# Patient Record
Sex: Male | Born: 1960 | Race: White | Hispanic: No | Marital: Married | Smoking: Never smoker
Health system: Southern US, Community
[De-identification: ages and names within clinical notes are randomized; demographics above are authoritative.]

## PROBLEM LIST (undated history)

## (undated) DIAGNOSIS — E538 Deficiency of other specified B group vitamins: Secondary | ICD-10-CM

## (undated) DIAGNOSIS — R040 Epistaxis: Secondary | ICD-10-CM

## (undated) DIAGNOSIS — I10 Essential (primary) hypertension: Secondary | ICD-10-CM

## (undated) DIAGNOSIS — M1990 Unspecified osteoarthritis, unspecified site: Secondary | ICD-10-CM

## (undated) DIAGNOSIS — K7581 Nonalcoholic steatohepatitis (NASH): Secondary | ICD-10-CM

## (undated) DIAGNOSIS — R58 Hemorrhage, not elsewhere classified: Secondary | ICD-10-CM

## (undated) DIAGNOSIS — E785 Hyperlipidemia, unspecified: Secondary | ICD-10-CM

## (undated) DIAGNOSIS — E559 Vitamin D deficiency, unspecified: Secondary | ICD-10-CM

## (undated) DIAGNOSIS — D61818 Other pancytopenia: Secondary | ICD-10-CM

## (undated) DIAGNOSIS — N189 Chronic kidney disease, unspecified: Secondary | ICD-10-CM

## (undated) DIAGNOSIS — D693 Immune thrombocytopenic purpura: Secondary | ICD-10-CM

## (undated) DIAGNOSIS — Z87442 Personal history of urinary calculi: Secondary | ICD-10-CM

## (undated) DIAGNOSIS — Z8619 Personal history of other infectious and parasitic diseases: Secondary | ICD-10-CM

## (undated) HISTORY — PX: SKIN GRAFT: SHX250

## (undated) HISTORY — PX: REPLACEMENT TOTAL KNEE: SUR1224

## (undated) HISTORY — PX: KNEE ARTHROSCOPY: SUR90

## (undated) HISTORY — PX: PYLOROPLASTY: SHX418

---

## 2004-05-11 HISTORY — PX: KNEE ARTHROSCOPY: SUR90

## 2004-11-30 ENCOUNTER — Emergency Department (HOSPITAL_COMMUNITY): Admission: EM | Admit: 2004-11-30 | Discharge: 2004-11-30 | Payer: Self-pay | Admitting: Emergency Medicine

## 2004-12-02 ENCOUNTER — Inpatient Hospital Stay (HOSPITAL_COMMUNITY): Admission: AD | Admit: 2004-12-02 | Discharge: 2004-12-06 | Payer: Self-pay | Admitting: Orthopedic Surgery

## 2009-06-11 HISTORY — PX: HERNIA REPAIR: SHX51

## 2009-06-21 ENCOUNTER — Observation Stay (HOSPITAL_COMMUNITY): Admission: EM | Admit: 2009-06-21 | Discharge: 2009-06-21 | Payer: Self-pay | Admitting: Emergency Medicine

## 2010-07-31 LAB — DIFFERENTIAL
Basophils Absolute: 0 K/uL (ref 0.0–0.1)
Basophils Relative: 1 % (ref 0–1)
Eosinophils Absolute: 0.2 K/uL (ref 0.0–0.7)
Eosinophils Relative: 2 % (ref 0–5)
Lymphocytes Relative: 29 % (ref 12–46)
Lymphs Abs: 2.2 K/uL (ref 0.7–4.0)
Monocytes Absolute: 0.4 K/uL (ref 0.1–1.0)
Monocytes Relative: 6 % (ref 3–12)
Neutro Abs: 4.8 K/uL (ref 1.7–7.7)
Neutrophils Relative %: 63 % (ref 43–77)

## 2010-07-31 LAB — CBC
HCT: 48.1 % (ref 39.0–52.0)
Hemoglobin: 16.2 g/dL (ref 13.0–17.0)
Platelets: 162 10*3/uL (ref 150–400)
RDW: 12.6 % (ref 11.5–15.5)

## 2010-07-31 LAB — COMPREHENSIVE METABOLIC PANEL
ALT: 59 U/L — ABNORMAL HIGH (ref 0–53)
BUN: 14 mg/dL (ref 6–23)
CO2: 26 mEq/L (ref 19–32)
Calcium: 9.7 mg/dL (ref 8.4–10.5)
Glucose, Bld: 119 mg/dL — ABNORMAL HIGH (ref 70–99)
Total Bilirubin: 1.2 mg/dL (ref 0.3–1.2)
Total Protein: 8.4 g/dL — ABNORMAL HIGH (ref 6.0–8.3)

## 2010-07-31 LAB — GLUCOSE, CAPILLARY: Glucose-Capillary: 119 mg/dL — ABNORMAL HIGH (ref 70–99)

## 2010-09-26 NOTE — Op Note (Signed)
[name], [name] NO.:  [id number]   MEDICAL RECORD NO.:  [id number]          PATIENT TYPE:  EMS   LOCATION:  MAJO                         FACILITY:  [facility]   PHYSICIAN:  Vanita Panda. Magnus Ivan, M.D.DATE OF BIRTH:  26-Jul-1960   DATE OF PROCEDURE:  11/30/2004  DATE OF DISCHARGE:                                 OPERATIVE REPORT   PREOPERATIVE DIAGNOSIS:  Right knee effusion with questionable infection,  status post arthroscopy five days ago.   POSTOPERATIVE DIAGNOSIS:  Right knee effusion with questionable infection,  status post arthroscopy five days ago.   OPERATION PERFORMED:  Right knee aspiration.   SURGEON:  Vanita Panda. Magnus Ivan, M.D.   ANESTHESIA:  1% local  lidocaine injection.   COMPLICATIONS:  None.   FINDINGS:  90 mL of serosanguineous and mainly bloody fluid from the right  knee.  Cultures, Gram stain and cell count pending.   COMPLICATIONS:  None.   INDICATIONS FOR PROCEDURE:  [name] is a 50 year old who underwent a  right knee arthroscopy by George Mustard, MD on this past Tuesday which was  five days ago.  The last couple of days he had increasing pain in his knee  and he reports at home he had a temperature of 102.  He finally came to the  emergency room today after continued pain in his knee.  The temperature was  100.  He has not been on any antibiotics.  A white blood cell count  peripherally was 13,000.  He had been on Vicodin and just Darvocet and had  continued pain and swelling of the knee.  On examination of the knee, he  certainly had painful attempts at range of motion.  There was a large  effusion of the knee.  It was warm.  There was no erythema, no drainage from  the arthroscopy sites.  Decision was made to aspirate the knee.   DESCRIPTION OF PROCEDURE:  After prepping the anterolateral corner of the  knee with Betadine and alcohol.  The skin was infiltrated with 1% plain  lidocaine.  I then inserted an 18 gauge  needle with a 60 mL syringe into the  knee through this anterolateral portal site and was able to aspirate 90 mL  of bloody fluid.  This was then sent to the lab for analysis of cell count,  Gram stain and culture.  Immediately following the procedure, I placed a  band-aid on  the leg and hemostasis was obtained easily.  The patient reported much  improvement in his level of comfort after the aspiration and there was much  less swelling on the knee.  The cultures are pending and the disposition  will depend on the Gram stain and the cell count.     _    CYB/MEDQ  D:  11/30/2004  T:  12/01/2004  Job:  [id number]

## 2010-09-26 NOTE — Discharge Summary (Signed)
NAMEJARID, SASSO NO.:  [id number]   MEDICAL RECORD NO.:  [id number]          PATIENT TYPE:  INP   LOCATION:  5022                         FACILITY:  [facility]   PHYSICIAN:  Nadara Mustard, MD     DATE OF BIRTH:  1960/08/05   DATE OF ADMISSION:  12/02/2004  DATE OF DISCHARGE:  12/06/2004                                 DISCHARGE SUMMARY   DIAGNOSIS:  Infected right knee, status post arthroscopy.   PROCEDURE:  Arthroscopic debridement.   DISCHARGE MEDICATIONS:  Clindamycin and Tylox.   DISCHARGE EQUIPMENT:  CPM machine for range of motion of the right knee.   Plan to follow up in the office in 1 week.   HISTORY OF PRESENT ILLNESS:  The patient is a 50 year old gentleman who has  developed an infection in his right knee status post arthroscopic  debridement for osteochondral defect and meniscal tear.  The patient has  undergone previous aspirations and presents at this time for arthroscopic  debridement.   HOSPITAL COURSE:  The patient's hospital course was essentially  unremarkable.  He was admitted on July 25 and underwent an irrigation and  debridement arthroscopically of the right knee.  Cultures were obtained x2  and a drain was placed deep in the wound.  Cultures were obtained.  The  patient was placed on IV Clindamycin.  Cultures were positive for coagulase-negative staph, which were sensitive to  the Clindamycin.  The patient was placed in a CPM due to his developing  arthrofibrosis and the patient was discharged to home with the CPM machine.  Cultures were finalized with coagulase-negative staph and were sensitive to  all antibiotics.  The patient has a prescription for Clindamycin and Tylox,  and he was discharged to home on December 06, 2004, in stable condition with  plan to follow up in the office on Monday.      Nadara Mustard, MD  Electronically Signed     MVD/MEDQ  D:  01/22/2005  T:  01/22/2005  Job:  [id number]

## 2010-09-26 NOTE — Op Note (Signed)
George Oneill, NAVARRETE NO.:  [id number]   MEDICAL RECORD NO.:  [id number]          PATIENT TYPE:  OIB   LOCATION:  3041                         FACILITY:  [facility]   PHYSICIAN:  Nadara Mustard, MD     DATE OF BIRTH:  04/04/1961   DATE OF PROCEDURE:  12/02/2004  DATE OF DISCHARGE:                                 OPERATIVE REPORT   PREOPERATIVE DIAGNOSIS:  Infected right knee, status post arthroscopy.   POSTOPERATIVE DIAGNOSIS:  Infected right knee, status post arthroscopy.   PROCEDURE:  Irrigation and debridement, arthroscopic debridement of the  right knee, cultures obtained x2.   SURGEON.:  Nadara Mustard, MD   ANESTHESIA:  General.   ESTIMATED BLOOD LOSS:  Minimal.   ANTIBIOTICS:  Clindamycin 600 mg IV.   DRAINS:  One medium Hemovac.   DISPOSITION:  To PACU in stable condition.  Plan for 23-hour observation.   INDICATION FOR PROCEDURE:  The patient is a 50 year old gentleman with  warmth and swelling of his right knee.  He is 1 week status post right knee  arthroscopy for debridement of osteochondral defect as well as meniscal  tears.  The patient initially presented to the emergency room on Sunday with  warmth and swelling of the knee.  This was aspirated by Dr. Magnus Ivan and  cultures were obtained.  The patient was started on p.o. clindamycin.  The  patient had followup in the office in 1 day.  The knee was re-aspirated;  again, synovial fluid findings were consistent with a hemarthrosis.  There  is no purulence.  There was no cellulitis.  The patient was then followed up  again today on Tuesday and the patient had recurrent swelling.  Due to his  persistent recurrent swelling, the patient now had a fever as high as 102.  There was no cellulitis, but due to his fever and recurrent swelling, the  patient was brought to the operating this time for a repeat arthroscopy for  irrigation, debridement and placement of a drain as well as placement on IV  antibiotics.  Risks and benefits were discussed including a persistent  infection, neurovascular injury, need for additional surgery.  The patient  states he understands and wishes  to proceed at this time.   DESCRIPTION OF PROCEDURE:  The patient was brought to OR room 5 and  underwent a general anesthetic.  After adequate level of anesthesia was  obtained, the patient was placed supine on the operating table and the right  lower extremity was prepped using DuraPrep and draped into a sterile field.  A superolateral portal was made and a large hemarthrosis was drained and  cultures, both aerobic and anaerobic, were obtained.  Inferior medial and  lateral arthroscopic portals were made.  The scope was inserted through the  inferolateral portal and inferomedial portal was used for a working portal  for the shaver.  All 3 compartments were debrided of inflamed synovitis.  Nine liters of irrigation were run through the knee.  The irrigation was  clear; there was no evidence of purulence.  A medium Hemovac was  placed  through the superolateral portal.  The inferior portals were closed using 4-  0 nylon.  The  wounds were covered with Adaptic, orthopedic sponges, sterile Webril, ABDs  and Coban dressing.  The patient was extubated and taken to the PACU in  stable condition.  Plan for continuation of Iv clindamycin.  we will check  his initial cultures and repeat cultures.  We will repeat a CBC and a C-  reactive protein in the morning.       MVD/MEDQ  D:  12/02/2004  T:  12/03/2004  Job:  [id number]

## 2011-01-13 NOTE — H&P (Signed)
  NTS SOAP Note  Vital Signs:  Vitals as of: 01/13/2011: Systolic 161: Diastolic 98: Heart Rate 85: Temp 98.63F: Height 54ft 4in: Weight 258Lbs 0 Ounces: BMI 31  BMI : 31.4 kg/m2  Subjective: This 50 Years 49 Months old Male presents for of RUQ pain. Some radiation to back.  Does increase with fatty foods but can occur at other times as well.  No fevers or chills.  No jaundice.  No change with BM but has noted diarrhea with mostly fatty foods.  Some bloating and gas/flatus.  Positive family history.    Review of Symptoms:  Constitutional:unremarkable Head:unremarkable Eyes:unremarkable Nose/Mouth/Throat:unremarkable Cardiovascular:unremarkable Respiratory:unremarkable as per HPI Genitourinary:unremarkable Musculoskeletal:unremarkable Skin:unremarkable Breast:unremarkable Hematolgic/Lymphatic:unremarkable Allergic/Immunologic:unremarkable    Past Medical History:Reviewed   Past Medical History  Surgical History: Umbilibal hernia (me), pyloromyotomy, Knee surgery Medical Problems: DM2, HTN Psychiatric History: none Allergies: PCN Medications: Lisinopril, metformin, allapurinol, ASA   Social History:Reviewed   Social History  Preferred Language: English ([place]) Race:  White Ethnicity: Not Hispanic / Latino Age: 50 Years 5 Months Marital Status:  M Alcohol:  No Recreational drug(s):  No   Smoking Status: Never smoker reviewed on 01/13/2011  Family History:Reviewed   Family History  Is there a family history of:DM, HTN   Medication Allergies:   Allergies Insert Code:   Objective Information: General:Well appearing, well nourished in no distress.obese Skin:no rash or prominent lesions Head:Atraumatic; no masses; no abnormalities Eyes:conjunctiva clear, EOM intact, PERRL Mouth:Mucous membranes moist, no mucosal lesions. Neck:Supple without lymphadenopathy.  Heart:RRR, no  murmur Lungs:CTA bilaterally, no wheezes, rhonchi, rales.  Breathing unlabored. Abdomen:Soft, NT/ND, no HSM, no masses.No classic murphy's sign. Extremities:No deformities, clubbing, cyanosis, or edema.     CT abd/pel 2/12:  + gallstones  Assessment:  Diagnosis &amp; Procedure: DiagnosisCode: 574.10, ProcedureCode: 16109,   Orders:    Plan: Findinngs discussed with patient.  Will schedule at his convenience.  Fatty food avoidance discussed.   Patient Education:Alternative treatments to surgery were discussed with patient (and family).Risks and benefits  of procedure were fully explained to the patient (and family) who gave informed consent. Patient/family questions were addressed.  Follow-up:Pending Surgery

## 2011-01-13 NOTE — Patient Instructions (Signed)
19 George Oneill  01/13/2011   Your procedure is scheduled on:  01/16/2011  Report to [facility] at  720  AM.  Call this number if you have problems the morning of surgery: [phone]   Remember:   Do not eat food:After Midnight.  Do not drink clear liquids: After Midnight.  Take these medicines the morning of surgery with A SIP OF WATER: none   Do not wear jewelry, make-up or nail polish.  Do not wear lotions, powders, or perfumes. You may wear deodorant.  Do not shave 48 hours prior to surgery.  Do not bring valuables to the hospital.  Contacts, dentures or bridgework may not be worn into surgery.  Leave suitcase in the car. After surgery it may be brought to your room.  For patients admitted to the hospital, checkout time is 11:00 AM the day of discharge.   Patients discharged the day of surgery will not be allowed to drive home.  Name and phone number of your driver: family  Special Instructions: CHG Shower Use Special Wash: 1/2 bottle night before surgery and 1/2 bottle morning of surgery.   Please read over the following fact sheets that you were given: Pain Booklet, MRSA Information, Surgical Site Infection Prevention, Anesthesia Post-op Instructions and Care and Recovery After Surgery PATIENT INSTRUCTIONS POST-ANESTHESIA  IMMEDIATELY FOLLOWING SURGERY:  Do not drive or operate machinery for the first twenty four hours after surgery.  Do not make any important decisions for twenty four hours after surgery or while taking narcotic pain medications or sedatives.  If you develop intractable nausea and vomiting or a severe headache please notify your doctor immediately.  FOLLOW-UP:  Please make an appointment with your surgeon as instructed. You do not need to follow up with anesthesia unless specifically instructed to do so.  WOUND CARE INSTRUCTIONS (if applicable):  Keep a dry clean dressing on the anesthesia/puncture wound site if there is drainage.  Once the wound has quit draining  you may leave it open to air.  Generally you should leave the bandage intact for twenty four hours unless there is drainage.  If the epidural site drains for more than 36-48 hours please call the anesthesia department.  QUESTIONS?:  Please feel free to call your physician or the hospital operator if you have any questions, and they will be happy to assist you.     [facility] Anesthesia Department [address] [place] [phone]

## 2011-01-14 ENCOUNTER — Other Ambulatory Visit: Payer: Self-pay

## 2011-01-14 ENCOUNTER — Encounter (HOSPITAL_COMMUNITY): Payer: Self-pay

## 2011-01-14 ENCOUNTER — Encounter (HOSPITAL_COMMUNITY)
Admission: RE | Admit: 2011-01-14 | Discharge: 2011-01-14 | Disposition: A | Discharge status: Home / Self Care | Payer: BC Managed Care – PPO | Source: Ambulatory Visit | Attending: General Surgery | Admitting: General Surgery

## 2011-01-14 HISTORY — DX: Chronic kidney disease, unspecified: N18.9

## 2011-01-14 HISTORY — DX: Essential (primary) hypertension: I10

## 2011-01-14 LAB — CBC
Hemoglobin: 15.5 g/dL (ref 13.0–17.0)
MCH: 31.2 pg (ref 26.0–34.0)
RBC: 4.97 MIL/uL (ref 4.22–5.81)
RDW: 13.4 % (ref 11.5–15.5)
WBC: 6.7 10*3/uL (ref 4.0–10.5)

## 2011-01-14 LAB — DIFFERENTIAL
Basophils Absolute: 0 10*3/uL (ref 0.0–0.1)
Basophils Relative: 0 % (ref 0–1)
Eosinophils Absolute: 0.1 10*3/uL (ref 0.0–0.7)
Eosinophils Relative: 1 % (ref 0–5)
Monocytes Absolute: 0.4 10*3/uL (ref 0.1–1.0)
Neutro Abs: 4.7 10*3/uL (ref 1.7–7.7)

## 2011-01-14 LAB — BASIC METABOLIC PANEL
BUN: 14 mg/dL (ref 6–23)
Chloride: 100 mEq/L (ref 96–112)
Creatinine, Ser: 0.86 mg/dL (ref 0.50–1.35)
GFR calc Af Amer: >60 mL/min (ref >60)
Glucose, Bld: 208 mg/dL — ABNORMAL HIGH (ref 70–99)
Sodium: 139 mEq/L (ref 135–145)

## 2011-01-16 ENCOUNTER — Other Ambulatory Visit: Payer: Self-pay | Admitting: General Surgery

## 2011-01-16 ENCOUNTER — Encounter (HOSPITAL_COMMUNITY)
Admission: RE | Disposition: A | Discharge status: Home / Self Care | Payer: Self-pay | Source: Ambulatory Visit | Attending: General Surgery

## 2011-01-16 ENCOUNTER — Encounter (HOSPITAL_COMMUNITY): Payer: Self-pay | Admitting: Anesthesiology

## 2011-01-16 ENCOUNTER — Ambulatory Visit (HOSPITAL_COMMUNITY)
Admission: RE | Admit: 2011-01-16 | Discharge: 2011-01-16 | Disposition: A | Discharge status: Home / Self Care | Payer: BC Managed Care – PPO | Source: Ambulatory Visit | Attending: General Surgery | Admitting: General Surgery

## 2011-01-16 ENCOUNTER — Encounter (HOSPITAL_COMMUNITY): Payer: Self-pay | Admitting: *Deleted

## 2011-01-16 ENCOUNTER — Ambulatory Visit (HOSPITAL_COMMUNITY): Payer: BC Managed Care – PPO | Admitting: Anesthesiology

## 2011-01-16 DIAGNOSIS — Z01812 Encounter for preprocedural laboratory examination: Secondary | ICD-10-CM | POA: Insufficient documentation

## 2011-01-16 DIAGNOSIS — E119 Type 2 diabetes mellitus without complications: Secondary | ICD-10-CM | POA: Insufficient documentation

## 2011-01-16 DIAGNOSIS — I10 Essential (primary) hypertension: Secondary | ICD-10-CM | POA: Insufficient documentation

## 2011-01-16 DIAGNOSIS — Z7982 Long term (current) use of aspirin: Secondary | ICD-10-CM | POA: Insufficient documentation

## 2011-01-16 DIAGNOSIS — K8020 Calculus of gallbladder without cholecystitis without obstruction: Secondary | ICD-10-CM | POA: Insufficient documentation

## 2011-01-16 DIAGNOSIS — Z79899 Other long term (current) drug therapy: Secondary | ICD-10-CM | POA: Insufficient documentation

## 2011-01-16 DIAGNOSIS — Z01810 Encounter for preprocedural cardiovascular examination: Secondary | ICD-10-CM | POA: Insufficient documentation

## 2011-01-16 HISTORY — PX: CHOLECYSTECTOMY: SHX55

## 2011-01-16 LAB — GLUCOSE, CAPILLARY: Glucose-Capillary: 161 mg/dL — ABNORMAL HIGH (ref 70–99)

## 2011-01-16 SURGERY — CHOLECYSTECTOMY
Anesthesia: General

## 2011-01-16 SURGERY — LAPAROSCOPIC CHOLECYSTECTOMY
Anesthesia: General | Site: Abdomen | Wound class: Clean Contaminated

## 2011-01-16 MED ORDER — BUPIVACAINE HCL (PF) 0.5 % IJ SOLN
INTRAMUSCULAR | Status: AC
  Filled 2011-01-16: qty 30

## 2011-01-16 MED ORDER — GLYCOPYRROLATE 0.2 MG/ML IJ SOLN
0.2000 mg | Freq: Once | INTRAMUSCULAR | Status: AC | PRN
  Administered 2011-01-16: 0.2 mg via INTRAVENOUS

## 2011-01-16 MED ORDER — HYDROCODONE-ACETAMINOPHEN 5-325 MG PO TABS: 1.0000 | ORAL_TABLET | ORAL | Status: AC | PRN

## 2011-01-16 MED ORDER — HEMOSTATIC AGENTS (NO CHARGE) OPTIME
TOPICAL | Status: DC | PRN
  Administered 2011-01-16: 1 via TOPICAL

## 2011-01-16 MED ORDER — ENOXAPARIN SODIUM 40 MG/0.4ML ~~LOC~~ SOLN
40.0000 mg | Freq: Once | SUBCUTANEOUS | Status: AC
  Administered 2011-01-16: 40 mg via SUBCUTANEOUS

## 2011-01-16 MED ORDER — BUPIVACAINE HCL (PF) 0.5 % IJ SOLN
INTRAMUSCULAR | Status: DC | PRN
  Administered 2011-01-16: 10 mL

## 2011-01-16 MED ORDER — FENTANYL CITRATE 0.05 MG/ML IJ SOLN
INTRAMUSCULAR | Status: AC
  Administered 2011-01-16: 50 ug via INTRAVENOUS
  Filled 2011-01-16: qty 2

## 2011-01-16 MED ORDER — NEOSTIGMINE METHYLSULFATE 1 MG/ML IJ SOLN
INTRAMUSCULAR | Status: DC | PRN
  Administered 2011-01-16 (×2): 1.25 mg via INTRAMUSCULAR

## 2011-01-16 MED ORDER — ONDANSETRON HCL 4 MG/2ML IJ SOLN
4.0000 mg | Freq: Once | INTRAMUSCULAR | Status: AC
  Administered 2011-01-16: 4 mg via INTRAVENOUS

## 2011-01-16 MED ORDER — CIPROFLOXACIN IN D5W 400 MG/200ML IV SOLN
INTRAVENOUS | Status: DC | PRN
  Administered 2011-01-16: 400 mg via INTRAVENOUS

## 2011-01-16 MED ORDER — FENTANYL CITRATE 0.05 MG/ML IJ SOLN
INTRAMUSCULAR | Status: AC
  Administered 2011-01-16: 50 ug via INTRAVENOUS
  Filled 2011-01-16: qty 5

## 2011-01-16 MED ORDER — ROCURONIUM BROMIDE 100 MG/10ML IV SOLN
INTRAVENOUS | Status: DC | PRN
  Administered 2011-01-16: 45 mg via INTRAVENOUS

## 2011-01-16 MED ORDER — LACTATED RINGERS IV SOLN
INTRAVENOUS | Status: DC
  Administered 2011-01-16: 09:00:00 via INTRAVENOUS

## 2011-01-16 MED ORDER — SODIUM CHLORIDE 0.9 % IR SOLN
Status: DC | PRN
  Administered 2011-01-16: 1000 mL

## 2011-01-16 MED ORDER — ONDANSETRON HCL 4 MG/2ML IJ SOLN
4.0000 mg | Freq: Once | INTRAMUSCULAR | Status: AC | PRN
  Administered 2011-01-16: 4 mg via INTRAVENOUS

## 2011-01-16 MED ORDER — CIPROFLOXACIN IN D5W 200 MG/100ML IV SOLN
INTRAVENOUS | Status: AC
  Filled 2011-01-16: qty 200

## 2011-01-16 MED ORDER — ONDANSETRON HCL 4 MG/2ML IJ SOLN
INTRAMUSCULAR | Status: AC
  Filled 2011-01-16: qty 2

## 2011-01-16 MED ORDER — ONDANSETRON HCL 4 MG/2ML IJ SOLN
INTRAMUSCULAR | Status: AC
  Administered 2011-01-16: 4 mg via INTRAVENOUS
  Filled 2011-01-16: qty 2

## 2011-01-16 MED ORDER — FENTANYL CITRATE 0.05 MG/ML IJ SOLN
25.0000 ug | INTRAMUSCULAR | Status: DC | PRN
  Administered 2011-01-16 (×4): 50 ug via INTRAVENOUS

## 2011-01-16 MED ORDER — ENOXAPARIN SODIUM 40 MG/0.4ML ~~LOC~~ SOLN
SUBCUTANEOUS | Status: AC
  Filled 2011-01-16: qty 0.4

## 2011-01-16 MED ORDER — PROPOFOL 10 MG/ML IV EMUL
INTRAVENOUS | Status: AC
  Filled 2011-01-16: qty 20

## 2011-01-16 MED ORDER — LACTATED RINGERS IV SOLN: INTRAVENOUS | Status: DC

## 2011-01-16 MED ORDER — FENTANYL CITRATE 0.05 MG/ML IJ SOLN
INTRAMUSCULAR | Status: DC | PRN
  Administered 2011-01-16 (×7): 50 ug via INTRAVENOUS

## 2011-01-16 MED ORDER — CIPROFLOXACIN IN D5W 400 MG/200ML IV SOLN: 400.0000 mg | INTRAVENOUS | Status: DC

## 2011-01-16 MED ORDER — HYDROMORPHONE HCL 1 MG/ML IJ SOLN
INTRAMUSCULAR | Status: AC
  Administered 2011-01-16: 0.5 mg via INTRAVENOUS
  Filled 2011-01-16: qty 1

## 2011-01-16 MED ORDER — GLYCOPYRROLATE 0.2 MG/ML IJ SOLN
INTRAMUSCULAR | Status: AC
  Filled 2011-01-16: qty 1

## 2011-01-16 MED ORDER — MIDAZOLAM HCL 2 MG/2ML IJ SOLN
1.0000 mg | INTRAMUSCULAR | Status: DC | PRN
  Administered 2011-01-16: 2 mg via INTRAVENOUS

## 2011-01-16 MED ORDER — PROPOFOL 10 MG/ML IV EMUL
INTRAVENOUS | Status: DC | PRN
  Administered 2011-01-16: 150 mg via INTRAVENOUS

## 2011-01-16 MED ORDER — HYDROMORPHONE HCL 1 MG/ML IJ SOLN
0.5000 mg | Freq: Once | INTRAMUSCULAR | Status: AC
  Administered 2011-01-16: 0.5 mg via INTRAVENOUS

## 2011-01-16 MED ORDER — NEOSTIGMINE METHYLSULFATE 1 MG/ML IJ SOLN
INTRAMUSCULAR | Status: AC
  Filled 2011-01-16: qty 10

## 2011-01-16 MED ORDER — ACETAMINOPHEN 325 MG PO TABS: 325.0000 mg | ORAL_TABLET | ORAL | Status: DC | PRN

## 2011-01-16 MED ORDER — CELECOXIB 100 MG PO CAPS
ORAL_CAPSULE | ORAL | Status: AC
  Administered 2011-01-16: 400 mg via ORAL
  Filled 2011-01-16: qty 4

## 2011-01-16 MED ORDER — GLYCOPYRROLATE 0.2 MG/ML IJ SOLN
INTRAMUSCULAR | Status: DC | PRN
  Administered 2011-01-16 (×2): 0.2 mg via INTRAVENOUS

## 2011-01-16 MED ORDER — MIDAZOLAM HCL 2 MG/2ML IJ SOLN
INTRAMUSCULAR | Status: AC
  Filled 2011-01-16: qty 2

## 2011-01-16 MED ORDER — CELECOXIB 100 MG PO CAPS
400.0000 mg | ORAL_CAPSULE | Freq: Every day | ORAL | Status: AC
  Administered 2011-01-16: 400 mg via ORAL

## 2011-01-16 SURGICAL SUPPLY — 34 items
APPLIER CLIP UNV 5X34 EPIX (ENDOMECHANICALS) ×2 IMPLANT
BAG HAMPER (MISCELLANEOUS) ×2 IMPLANT
BENZOIN TINCTURE PRP APPL 2/3 (GAUZE/BANDAGES/DRESSINGS) ×2 IMPLANT
CLOTH BEACON ORANGE TIMEOUT ST (SAFETY) ×2 IMPLANT
COVER LIGHT HANDLE STERIS (MISCELLANEOUS) ×4 IMPLANT
DECANTER SPIKE VIAL GLASS SM (MISCELLANEOUS) ×2 IMPLANT
DEVICE TROCAR PUNCTURE CLOSURE (ENDOMECHANICALS) ×2 IMPLANT
DURAPREP 26ML APPLICATOR (WOUND CARE) ×2 IMPLANT
ELECT REM PT RETURN 9FT ADLT (ELECTROSURGICAL) ×2
ELECTRODE REM PT RTRN 9FT ADLT (ELECTROSURGICAL) ×1 IMPLANT
FILTER SMOKE EVAC LAPAROSHD (FILTER) ×2 IMPLANT
FORMALIN 10 PREFIL 120ML (MISCELLANEOUS) ×2 IMPLANT
GLOVE BIOGEL PI IND STRL 7.5 (GLOVE) ×1 IMPLANT
GLOVE BIOGEL PI INDICATOR 7.5 (GLOVE) ×1
GLOVE ECLIPSE 6.5 STRL STRAW (GLOVE) ×2 IMPLANT
GLOVE ECLIPSE 7.0 STRL STRAW (GLOVE) ×4 IMPLANT
GLOVE INDICATOR 7.0 STRL GRN (GLOVE) ×4 IMPLANT
GOWN BRE IMP SLV AUR XL STRL (GOWN DISPOSABLE) ×6 IMPLANT
HEMOSTAT SNOW SURGICEL 2X4 (HEMOSTASIS) ×2 IMPLANT
INST SET LAPROSCOPIC AP (KITS) ×2 IMPLANT
IV NS IRRIG 3000ML ARTHROMATIC (IV SOLUTION) ×2 IMPLANT
KIT ROOM TURNOVER APOR (KITS) ×2 IMPLANT
KIT TROCAR LAP CHOLE (TROCAR) ×2 IMPLANT
MANIFOLD NEPTUNE II (INSTRUMENTS) ×2 IMPLANT
PACK LAP CHOLE LZT030E (CUSTOM PROCEDURE TRAY) ×2 IMPLANT
PAD ARMBOARD 7.5X6 YLW CONV (MISCELLANEOUS) ×2 IMPLANT
POUCH SPECIMEN RETRIEVAL 10MM (ENDOMECHANICALS) ×2 IMPLANT
SET BASIN LINEN APH (SET/KITS/TRAYS/PACK) ×2 IMPLANT
SET TUBE IRRIG SUCTION NO TIP (IRRIGATION / IRRIGATOR) ×2 IMPLANT
SLEEVE Z-THREAD 5X100MM (TROCAR) ×2 IMPLANT
STRIP CLOSURE SKIN 1/2X4 (GAUZE/BANDAGES/DRESSINGS) ×2 IMPLANT
SUT MNCRL AB 4-0 PS2 18 (SUTURE) ×4 IMPLANT
SUT VIC AB 2-0 CT2 27 (SUTURE) ×4 IMPLANT
WARMER LAPAROSCOPE (MISCELLANEOUS) ×2 IMPLANT

## 2011-01-16 NOTE — Interval H&P Note (Signed)
History and Physical Interval Note:   01/16/2011   9:21 AM   George Oneill  has presented today for surgery, with the diagnosis of cholelithiasis  The various methods of treatment have been discussed with the patient and family. After consideration of risks, benefits and other options for treatment, the patient has consented to  Procedure(s): LAPAROSCOPIC CHOLECYSTECTOMY as a surgical intervention .  I have reviewed the patients' chart and labs.  Questions were answered to the patient's satisfaction.     Fabio Bering  MD

## 2011-01-16 NOTE — Anesthesia Procedure Notes (Addendum)
Procedure Name: Intubation Date/Time: 01/16/2011 9:30 AM Performed by: Marylene Buerger Pre-anesthesia Checklist: Patient identified Patient Re-evaluated:Patient Re-evaluated prior to inductionOxygen Delivery Method: Circle System Utilized Preoxygenation: Pre-oxygenation with 100% oxygen   Date/Time: 01/16/2011 9:35 AM Performed by: Marylene Buerger Ventilation: Mask ventilation without difficulty Laryngoscope Size: Mac and 3 Grade View: Grade I Tube size: 8.0 mm Number of attempts: 1 Placement Confirmation: ETT inserted through vocal cords under direct vision,  positive ETCO2 and breath sounds checked- equal and bilateral Secured at: 22 cm Tube secured with: Tape Dental Injury: Teeth and Oropharynx as per pre-operative assessment

## 2011-01-16 NOTE — Anesthesia Preprocedure Evaluation (Signed)
Anesthesia Evaluation  Name, MR# and DOB Patient awake  General Assessment Comment  Reviewed: Allergy & Precautions, H&P , NPO status , Patient's Chart, lab work & pertinent test results  Airway Mallampati: I TM Distance: >3 FB Neck ROM: Full    Dental No notable dental hx.    Pulmonary    pulmonary exam normalPulmonary Exam Normal     Cardiovascular hypertension, Pt. on medications Regular Normal    Neuro/Psych Negative Neurological ROS  Negative Psych ROS  GI/Hepatic/Renal negative GI ROS  negative Liver ROS        Renal disease (chronic kidney stones)  Endo/Other  (+) Diabetes mellitus-, Well Controlled, Type 2, Oral Hypoglycemic Agents,     Abdominal Normal abdominal exam  (+)   Musculoskeletal negative musculoskeletal ROS (+)   Hematology negative hematology ROS (+)   Peds  Reproductive/Obstetrics    Anesthesia Other Findings             Anesthesia Physical Anesthesia Plan  ASA: II  Anesthesia Plan: General   Post-op Pain Management:    Induction: Intravenous  Airway Management Planned: Oral ETT  Additional Equipment:   Intra-op Plan:   Post-operative Plan: Extubation in OR  Informed Consent: I have reviewed the patients History and Physical, chart, labs and discussed the procedure including the risks, benefits and alternatives for the proposed anesthesia with the patient or authorized representative who has indicated his/her understanding and acceptance.     Plan Discussed with: CRNA  Anesthesia Plan Comments:         Anesthesia Quick Evaluation

## 2011-01-16 NOTE — Anesthesia Postprocedure Evaluation (Signed)
  Anesthesia Post-op Note  Patient: George Oneill  Procedure(s) Performed:  LAPAROSCOPIC CHOLECYSTECTOMY  Patient Location: PACU  Anesthesia Type: General  Level of Consciousness: awake, alert  and oriented  Airway and Oxygen Therapy: Patient Spontanous Breathing and Patient connected to face mask oxygen  Post-op Pain: mild  Post-op Assessment: Post-op Vital signs reviewed, Patient's Cardiovascular Status Stable and Respiratory Function Stable  Post-op Vital Signs: Reviewed and stable  Complications: No apparent anesthesia complications

## 2011-01-16 NOTE — Transfer of Care (Signed)
Immediate Anesthesia Transfer of Care Note  Patient: George Oneill  Procedure(s) Performed:  LAPAROSCOPIC CHOLECYSTECTOMY  Patient Location: PACU  Anesthesia Type: General  Level of Consciousness: awake, alert  and oriented  Airway & Oxygen Therapy: Patient Spontanous Breathing and Patient connected to face mask oxygen  Post-op Assessment: Report given to PACU RN  Post vital signs: Reviewed and stable  Complications: No apparent anesthesia complications

## 2011-01-16 NOTE — Op Note (Signed)
Patient:  George Oneill  DOB:  [birth year]  MRN:  [id number]   Preop Diagnosis:  Cholelithiasis  Postop Diagnosis:  Same  Procedure:  Laparoscopic cholecystectomy  Surgeon:  Dr. Tilford Pillar  Anes:  General endotracheal  Indications:  Patient is a 50 year old male presented to my office with a history of right upper quadrant abdominal pain. Workup and evaluation was consistent for cholelithiasis. Risks benefits alternatives of a laparoscopic possible open cholecystectomy were discussed at length the patient including but not limited to the risk of bleeding, infection, bile leak, small bowel injury, common bile duct injury, possible intraoperative cardiac and pulmonary events. Patient's questions and concerns were addressed the patient was consented for the planned procedure.  Procedure note:  She was taken to the or was placed in a supine position on the operating table time the general anesthetic was administered. Once patient was asleep he was endotracheally intubated. At this point his abdomen is prepped with DuraPrep solution and draped in standard fashion. An incision was created infraumbilically and a previous umbilical incisional scar. Additional dissection down to subcuticular tissues carried using a Coker clamp was utilized to grasp the anterior abdominal wall fascia and lift this anteriorly. A Veress needle was inserted saline drop test is utilized to confirm intraperitoneal placement and then pneumoperitoneum was initiated. Once sufficient pneumoperitoneum was obtained an 11 mm trocar was inserted over a laparoscope allowing visualization the trocar entering into the peritoneal cavity. At this point the inner cannulas removed the laparoscope was reinserted there is no the same trocar peritoneal placement injury. At this point the remaining trochars placed the 5 mm trocar in the epigastrium a 5 mm in the midline between 2 most is a 5 mm, right lateral abdominal wall. At this point the  patient was placed into a reverse Trendelenburg left lateral decubitus position. The liver was noted to have some granular appearance consistent with suspicion of early cirrhotic changes. Nodularity or masses were noted. The fundus of the gallbladder was grasped and lifted up and over the right lobe the liver. Blunt peritoneal dissection was carried out using a [place] to expose the infundibulum and the cystic duct as it entered into the infundibulum. A window was created behind the cystic duct 3 endoclips placed proximally one distally and the cystic duct was divided between 2 most distal clips. Similarly the cystic artery was identified a window was created behind the cystic artery and 2 endoclips placed proximally one distally and the cystic artery was divided between 2 most distal clips. At this point the gallbladder is is remove the gallbladder fossa using electric cautery and is placed into an Endo Catch bag. The gallbladder fossa is noted. Raw and several areas of bleeding were encountered these were quickly controlled using electrocautery. At this point did copiously irrigate the surgical field with sterile saline to the returning aspirate was clear I inspection the gallbladder fossa demonstrated as of any ongoing bleeding the endoclips were noted to be in excellent position with no evidence of any bleeding or bile leak. I did opt to place a piece of Surgicel snow into the gallbladder fossa to help with long-standing hemostasis. At this point I turned my attention to closure  Using an Endo Close suture passing device a 2-0 Vicryl sutures passed to the umbilical trocar site. With this suture and placed the gallbladder was retrieved was removed through the umbilical trocar site and intact Endo Catch bag. Some blunt dilatation was required to adequately enlarged the local trocar  site adequately to remove the gallbladder. The gallbladder was retrieved and intact Endo Catch bag was placed in the back  table was sent as a permanent specimen to pathology. At this point the pneumoperitoneum was evacuated the trochars were removed the Vicryl sutures secured. The local anesthetic is instilled at all 4 trocar sites. The skin edges were reapproximated all 4 trocar sites with a 4-0 Monocryl in a running subcuticular suture. The skin was washed dried moist dry towel. Benzoin is applied around incision. Half-inch Steri-Strips are placed. The drapes removed. The patient was allowed to mild general anesthetic and transferred back to the postanesthetic care unit stable condition. At the conclusion of procedure all instrument sponge and needle counts are correct. Patient tolerated procedure extremely well.  Complications:  None  EBL:  150 mL  Specimen:  Gallbladder

## 2011-01-23 ENCOUNTER — Encounter (HOSPITAL_COMMUNITY): Payer: Self-pay | Admitting: General Surgery

## 2015-05-12 HISTORY — PX: KNEE ARTHROSCOPY: SHX127

## 2015-09-09 DIAGNOSIS — S46011A Strain of muscle(s) and tendon(s) of the rotator cuff of right shoulder, initial encounter: Secondary | ICD-10-CM | POA: Diagnosis not present

## 2015-09-09 DIAGNOSIS — Z6832 Body mass index (BMI) 32.0-32.9, adult: Secondary | ICD-10-CM | POA: Diagnosis not present

## 2015-10-03 DIAGNOSIS — M25511 Pain in right shoulder: Secondary | ICD-10-CM | POA: Diagnosis not present

## 2015-10-09 ENCOUNTER — Other Ambulatory Visit (HOSPITAL_COMMUNITY): Payer: Self-pay | Admitting: Specialist

## 2015-10-09 DIAGNOSIS — M25511 Pain in right shoulder: Secondary | ICD-10-CM

## 2015-10-15 ENCOUNTER — Ambulatory Visit (HOSPITAL_COMMUNITY)
Admission: RE | Admit: 2015-10-15 | Discharge: 2015-10-15 | Disposition: A | Discharge status: Home / Self Care | Payer: BLUE CROSS/BLUE SHIELD | Source: Ambulatory Visit | Attending: Specialist | Admitting: Specialist

## 2015-10-15 DIAGNOSIS — M25511 Pain in right shoulder: Secondary | ICD-10-CM | POA: Insufficient documentation

## 2015-10-15 DIAGNOSIS — M19011 Primary osteoarthritis, right shoulder: Secondary | ICD-10-CM | POA: Diagnosis not present

## 2015-10-28 DIAGNOSIS — M75111 Incomplete rotator cuff tear or rupture of right shoulder, not specified as traumatic: Secondary | ICD-10-CM | POA: Diagnosis not present

## 2015-10-28 DIAGNOSIS — M19011 Primary osteoarthritis, right shoulder: Secondary | ICD-10-CM | POA: Diagnosis not present

## 2015-10-28 DIAGNOSIS — M25511 Pain in right shoulder: Secondary | ICD-10-CM | POA: Diagnosis not present

## 2015-10-31 DIAGNOSIS — M6281 Muscle weakness (generalized): Secondary | ICD-10-CM | POA: Diagnosis not present

## 2015-10-31 DIAGNOSIS — M19011 Primary osteoarthritis, right shoulder: Secondary | ICD-10-CM | POA: Diagnosis not present

## 2015-10-31 DIAGNOSIS — M75111 Incomplete rotator cuff tear or rupture of right shoulder, not specified as traumatic: Secondary | ICD-10-CM | POA: Diagnosis not present

## 2015-10-31 DIAGNOSIS — M25511 Pain in right shoulder: Secondary | ICD-10-CM | POA: Diagnosis not present

## 2015-11-25 DIAGNOSIS — M25511 Pain in right shoulder: Secondary | ICD-10-CM | POA: Diagnosis not present

## 2015-11-25 DIAGNOSIS — M19011 Primary osteoarthritis, right shoulder: Secondary | ICD-10-CM | POA: Diagnosis not present

## 2015-11-25 DIAGNOSIS — M75111 Incomplete rotator cuff tear or rupture of right shoulder, not specified as traumatic: Secondary | ICD-10-CM | POA: Diagnosis not present

## 2015-12-14 DIAGNOSIS — E6609 Other obesity due to excess calories: Secondary | ICD-10-CM | POA: Diagnosis not present

## 2015-12-14 DIAGNOSIS — E782 Mixed hyperlipidemia: Secondary | ICD-10-CM | POA: Diagnosis not present

## 2015-12-14 DIAGNOSIS — M1000 Idiopathic gout, unspecified site: Secondary | ICD-10-CM | POA: Diagnosis not present

## 2015-12-14 DIAGNOSIS — I10 Essential (primary) hypertension: Secondary | ICD-10-CM | POA: Diagnosis not present

## 2015-12-17 DIAGNOSIS — M75111 Incomplete rotator cuff tear or rupture of right shoulder, not specified as traumatic: Secondary | ICD-10-CM | POA: Diagnosis not present

## 2015-12-17 DIAGNOSIS — M19011 Primary osteoarthritis, right shoulder: Secondary | ICD-10-CM | POA: Diagnosis not present

## 2015-12-17 DIAGNOSIS — M7541 Impingement syndrome of right shoulder: Secondary | ICD-10-CM | POA: Diagnosis not present

## 2015-12-17 DIAGNOSIS — G8918 Other acute postprocedural pain: Secondary | ICD-10-CM | POA: Diagnosis not present

## 2015-12-17 DIAGNOSIS — M24111 Other articular cartilage disorders, right shoulder: Secondary | ICD-10-CM | POA: Diagnosis not present

## 2015-12-24 DIAGNOSIS — M25511 Pain in right shoulder: Secondary | ICD-10-CM | POA: Diagnosis not present

## 2015-12-24 DIAGNOSIS — M6281 Muscle weakness (generalized): Secondary | ICD-10-CM | POA: Diagnosis not present

## 2015-12-24 DIAGNOSIS — M25611 Stiffness of right shoulder, not elsewhere classified: Secondary | ICD-10-CM | POA: Diagnosis not present

## 2015-12-27 DIAGNOSIS — M25511 Pain in right shoulder: Secondary | ICD-10-CM | POA: Diagnosis not present

## 2015-12-27 DIAGNOSIS — M6281 Muscle weakness (generalized): Secondary | ICD-10-CM | POA: Diagnosis not present

## 2015-12-27 DIAGNOSIS — M25611 Stiffness of right shoulder, not elsewhere classified: Secondary | ICD-10-CM | POA: Diagnosis not present

## 2015-12-31 DIAGNOSIS — M25611 Stiffness of right shoulder, not elsewhere classified: Secondary | ICD-10-CM | POA: Diagnosis not present

## 2015-12-31 DIAGNOSIS — M25511 Pain in right shoulder: Secondary | ICD-10-CM | POA: Diagnosis not present

## 2015-12-31 DIAGNOSIS — M6281 Muscle weakness (generalized): Secondary | ICD-10-CM | POA: Diagnosis not present

## 2016-01-29 DIAGNOSIS — M25511 Pain in right shoulder: Secondary | ICD-10-CM | POA: Diagnosis not present

## 2016-01-29 DIAGNOSIS — M6281 Muscle weakness (generalized): Secondary | ICD-10-CM | POA: Diagnosis not present

## 2016-01-29 DIAGNOSIS — M25611 Stiffness of right shoulder, not elsewhere classified: Secondary | ICD-10-CM | POA: Diagnosis not present

## 2016-01-31 DIAGNOSIS — M25611 Stiffness of right shoulder, not elsewhere classified: Secondary | ICD-10-CM | POA: Diagnosis not present

## 2016-01-31 DIAGNOSIS — M6281 Muscle weakness (generalized): Secondary | ICD-10-CM | POA: Diagnosis not present

## 2016-01-31 DIAGNOSIS — M25511 Pain in right shoulder: Secondary | ICD-10-CM | POA: Diagnosis not present

## 2016-02-11 DIAGNOSIS — M25511 Pain in right shoulder: Secondary | ICD-10-CM | POA: Diagnosis not present

## 2016-02-11 DIAGNOSIS — M25611 Stiffness of right shoulder, not elsewhere classified: Secondary | ICD-10-CM | POA: Diagnosis not present

## 2016-02-11 DIAGNOSIS — M6281 Muscle weakness (generalized): Secondary | ICD-10-CM | POA: Diagnosis not present

## 2016-02-14 DIAGNOSIS — M25611 Stiffness of right shoulder, not elsewhere classified: Secondary | ICD-10-CM | POA: Diagnosis not present

## 2016-02-14 DIAGNOSIS — M25511 Pain in right shoulder: Secondary | ICD-10-CM | POA: Diagnosis not present

## 2016-02-14 DIAGNOSIS — M6281 Muscle weakness (generalized): Secondary | ICD-10-CM | POA: Diagnosis not present

## 2016-02-18 DIAGNOSIS — M25611 Stiffness of right shoulder, not elsewhere classified: Secondary | ICD-10-CM | POA: Diagnosis not present

## 2016-02-18 DIAGNOSIS — M6281 Muscle weakness (generalized): Secondary | ICD-10-CM | POA: Diagnosis not present

## 2016-02-18 DIAGNOSIS — M25511 Pain in right shoulder: Secondary | ICD-10-CM | POA: Diagnosis not present

## 2016-04-27 DIAGNOSIS — M1000 Idiopathic gout, unspecified site: Secondary | ICD-10-CM | POA: Diagnosis not present

## 2016-04-27 DIAGNOSIS — E1165 Type 2 diabetes mellitus with hyperglycemia: Secondary | ICD-10-CM | POA: Diagnosis not present

## 2016-04-27 DIAGNOSIS — E782 Mixed hyperlipidemia: Secondary | ICD-10-CM | POA: Diagnosis not present

## 2016-04-27 DIAGNOSIS — I10 Essential (primary) hypertension: Secondary | ICD-10-CM | POA: Diagnosis not present

## 2016-05-02 DIAGNOSIS — E782 Mixed hyperlipidemia: Secondary | ICD-10-CM | POA: Diagnosis not present

## 2016-05-02 DIAGNOSIS — Z23 Encounter for immunization: Secondary | ICD-10-CM | POA: Diagnosis not present

## 2016-05-02 DIAGNOSIS — M1000 Idiopathic gout, unspecified site: Secondary | ICD-10-CM | POA: Diagnosis not present

## 2016-05-02 DIAGNOSIS — I10 Essential (primary) hypertension: Secondary | ICD-10-CM | POA: Diagnosis not present

## 2016-05-02 DIAGNOSIS — E6609 Other obesity due to excess calories: Secondary | ICD-10-CM | POA: Diagnosis not present

## 2016-08-08 DIAGNOSIS — J321 Chronic frontal sinusitis: Secondary | ICD-10-CM | POA: Diagnosis not present

## 2016-08-08 DIAGNOSIS — Z6832 Body mass index (BMI) 32.0-32.9, adult: Secondary | ICD-10-CM | POA: Diagnosis not present

## 2016-08-22 DIAGNOSIS — E782 Mixed hyperlipidemia: Secondary | ICD-10-CM | POA: Diagnosis not present

## 2016-08-22 DIAGNOSIS — E6609 Other obesity due to excess calories: Secondary | ICD-10-CM | POA: Diagnosis not present

## 2016-08-22 DIAGNOSIS — I10 Essential (primary) hypertension: Secondary | ICD-10-CM | POA: Diagnosis not present

## 2016-08-22 DIAGNOSIS — M1000 Idiopathic gout, unspecified site: Secondary | ICD-10-CM | POA: Diagnosis not present

## 2016-10-13 DIAGNOSIS — M238X2 Other internal derangements of left knee: Secondary | ICD-10-CM | POA: Diagnosis not present

## 2016-10-13 DIAGNOSIS — M25562 Pain in left knee: Secondary | ICD-10-CM | POA: Diagnosis not present

## 2016-10-20 DIAGNOSIS — M238X2 Other internal derangements of left knee: Secondary | ICD-10-CM | POA: Diagnosis not present

## 2016-10-26 DIAGNOSIS — S83232A Complex tear of medial meniscus, current injury, left knee, initial encounter: Secondary | ICD-10-CM | POA: Diagnosis not present

## 2016-10-26 DIAGNOSIS — M1712 Unilateral primary osteoarthritis, left knee: Secondary | ICD-10-CM | POA: Diagnosis not present

## 2016-11-12 DIAGNOSIS — M23332 Other meniscus derangements, other medial meniscus, left knee: Secondary | ICD-10-CM | POA: Diagnosis not present

## 2016-11-12 DIAGNOSIS — M25561 Pain in right knee: Secondary | ICD-10-CM | POA: Diagnosis not present

## 2016-11-12 DIAGNOSIS — G8918 Other acute postprocedural pain: Secondary | ICD-10-CM | POA: Diagnosis not present

## 2016-11-12 DIAGNOSIS — M1712 Unilateral primary osteoarthritis, left knee: Secondary | ICD-10-CM | POA: Diagnosis not present

## 2016-11-12 DIAGNOSIS — M2242 Chondromalacia patellae, left knee: Secondary | ICD-10-CM | POA: Diagnosis not present

## 2016-11-12 DIAGNOSIS — S83242A Other tear of medial meniscus, current injury, left knee, initial encounter: Secondary | ICD-10-CM | POA: Diagnosis not present

## 2016-11-12 DIAGNOSIS — M94262 Chondromalacia, left knee: Secondary | ICD-10-CM | POA: Diagnosis not present

## 2016-11-12 DIAGNOSIS — S83232A Complex tear of medial meniscus, current injury, left knee, initial encounter: Secondary | ICD-10-CM | POA: Diagnosis not present

## 2016-11-20 DIAGNOSIS — R2689 Other abnormalities of gait and mobility: Secondary | ICD-10-CM | POA: Diagnosis not present

## 2016-11-20 DIAGNOSIS — M6281 Muscle weakness (generalized): Secondary | ICD-10-CM | POA: Diagnosis not present

## 2016-11-20 DIAGNOSIS — M25562 Pain in left knee: Secondary | ICD-10-CM | POA: Diagnosis not present

## 2016-11-24 DIAGNOSIS — M25562 Pain in left knee: Secondary | ICD-10-CM | POA: Diagnosis not present

## 2016-11-24 DIAGNOSIS — R2689 Other abnormalities of gait and mobility: Secondary | ICD-10-CM | POA: Diagnosis not present

## 2016-11-24 DIAGNOSIS — M6281 Muscle weakness (generalized): Secondary | ICD-10-CM | POA: Diagnosis not present

## 2016-11-27 DIAGNOSIS — M6281 Muscle weakness (generalized): Secondary | ICD-10-CM | POA: Diagnosis not present

## 2016-11-27 DIAGNOSIS — M25562 Pain in left knee: Secondary | ICD-10-CM | POA: Diagnosis not present

## 2016-11-27 DIAGNOSIS — R2689 Other abnormalities of gait and mobility: Secondary | ICD-10-CM | POA: Diagnosis not present

## 2016-12-01 DIAGNOSIS — M25562 Pain in left knee: Secondary | ICD-10-CM | POA: Diagnosis not present

## 2016-12-01 DIAGNOSIS — R2689 Other abnormalities of gait and mobility: Secondary | ICD-10-CM | POA: Diagnosis not present

## 2016-12-01 DIAGNOSIS — M6281 Muscle weakness (generalized): Secondary | ICD-10-CM | POA: Diagnosis not present

## 2016-12-03 DIAGNOSIS — M25562 Pain in left knee: Secondary | ICD-10-CM | POA: Diagnosis not present

## 2016-12-03 DIAGNOSIS — M6281 Muscle weakness (generalized): Secondary | ICD-10-CM | POA: Diagnosis not present

## 2016-12-03 DIAGNOSIS — R2689 Other abnormalities of gait and mobility: Secondary | ICD-10-CM | POA: Diagnosis not present

## 2016-12-07 DIAGNOSIS — E1165 Type 2 diabetes mellitus with hyperglycemia: Secondary | ICD-10-CM | POA: Diagnosis not present

## 2016-12-07 DIAGNOSIS — E782 Mixed hyperlipidemia: Secondary | ICD-10-CM | POA: Diagnosis not present

## 2016-12-07 DIAGNOSIS — I10 Essential (primary) hypertension: Secondary | ICD-10-CM | POA: Diagnosis not present

## 2016-12-07 DIAGNOSIS — E7800 Pure hypercholesterolemia, unspecified: Secondary | ICD-10-CM | POA: Diagnosis not present

## 2016-12-12 DIAGNOSIS — E119 Type 2 diabetes mellitus without complications: Secondary | ICD-10-CM | POA: Diagnosis not present

## 2016-12-12 DIAGNOSIS — M1000 Idiopathic gout, unspecified site: Secondary | ICD-10-CM | POA: Diagnosis not present

## 2016-12-12 DIAGNOSIS — E782 Mixed hyperlipidemia: Secondary | ICD-10-CM | POA: Diagnosis not present

## 2016-12-12 DIAGNOSIS — I10 Essential (primary) hypertension: Secondary | ICD-10-CM | POA: Diagnosis not present

## 2016-12-28 DIAGNOSIS — D3132 Benign neoplasm of left choroid: Secondary | ICD-10-CM | POA: Diagnosis not present

## 2017-03-03 DIAGNOSIS — M1712 Unilateral primary osteoarthritis, left knee: Secondary | ICD-10-CM | POA: Diagnosis not present

## 2017-03-09 DIAGNOSIS — M1712 Unilateral primary osteoarthritis, left knee: Secondary | ICD-10-CM | POA: Diagnosis not present

## 2017-03-12 DIAGNOSIS — M545 Low back pain: Secondary | ICD-10-CM | POA: Diagnosis not present

## 2017-03-12 DIAGNOSIS — Z6832 Body mass index (BMI) 32.0-32.9, adult: Secondary | ICD-10-CM | POA: Diagnosis not present

## 2017-03-12 DIAGNOSIS — Z23 Encounter for immunization: Secondary | ICD-10-CM | POA: Diagnosis not present

## 2017-03-12 DIAGNOSIS — M25552 Pain in left hip: Secondary | ICD-10-CM | POA: Diagnosis not present

## 2017-03-15 DIAGNOSIS — S73101A Unspecified sprain of right hip, initial encounter: Secondary | ICD-10-CM | POA: Diagnosis not present

## 2017-03-15 DIAGNOSIS — M47816 Spondylosis without myelopathy or radiculopathy, lumbar region: Secondary | ICD-10-CM | POA: Diagnosis not present

## 2017-03-15 DIAGNOSIS — M5441 Lumbago with sciatica, right side: Secondary | ICD-10-CM | POA: Diagnosis not present

## 2017-03-17 DIAGNOSIS — M5441 Lumbago with sciatica, right side: Secondary | ICD-10-CM | POA: Diagnosis not present

## 2017-03-17 DIAGNOSIS — S73101A Unspecified sprain of right hip, initial encounter: Secondary | ICD-10-CM | POA: Diagnosis not present

## 2017-03-17 DIAGNOSIS — M47816 Spondylosis without myelopathy or radiculopathy, lumbar region: Secondary | ICD-10-CM | POA: Diagnosis not present

## 2017-03-17 DIAGNOSIS — M1712 Unilateral primary osteoarthritis, left knee: Secondary | ICD-10-CM | POA: Diagnosis not present

## 2017-04-07 DIAGNOSIS — E7800 Pure hypercholesterolemia, unspecified: Secondary | ICD-10-CM | POA: Diagnosis not present

## 2017-04-07 DIAGNOSIS — E1165 Type 2 diabetes mellitus with hyperglycemia: Secondary | ICD-10-CM | POA: Diagnosis not present

## 2017-04-07 DIAGNOSIS — E782 Mixed hyperlipidemia: Secondary | ICD-10-CM | POA: Diagnosis not present

## 2017-04-07 DIAGNOSIS — I10 Essential (primary) hypertension: Secondary | ICD-10-CM | POA: Diagnosis not present

## 2017-05-01 DIAGNOSIS — M1000 Idiopathic gout, unspecified site: Secondary | ICD-10-CM | POA: Diagnosis not present

## 2017-05-01 DIAGNOSIS — E119 Type 2 diabetes mellitus without complications: Secondary | ICD-10-CM | POA: Diagnosis not present

## 2017-05-01 DIAGNOSIS — E782 Mixed hyperlipidemia: Secondary | ICD-10-CM | POA: Diagnosis not present

## 2017-05-01 DIAGNOSIS — I10 Essential (primary) hypertension: Secondary | ICD-10-CM | POA: Diagnosis not present

## 2017-06-21 DIAGNOSIS — Z6833 Body mass index (BMI) 33.0-33.9, adult: Secondary | ICD-10-CM | POA: Diagnosis not present

## 2017-06-21 DIAGNOSIS — J0101 Acute recurrent maxillary sinusitis: Secondary | ICD-10-CM | POA: Diagnosis not present

## 2017-07-19 IMAGING — MR MR SHOULDER*R* W/O CM
4 of 6 series · 19 of 40 positions shown · non-contrast
Comparison: None.

CLINICAL DATA: Right shoulder pain for 4 weeks. No known injury.
Initial encounter.

EXAM:
MRI OF THE RIGHT SHOULDER WITHOUT CONTRAST
TECHNIQUE: Multiplanar, multisequence MR imaging of the shoulder was performed.
No intravenous contrast was administered.

[Series 3: t2fs axial · axial · 3.0mm · 0.31mm/px · z∈[+1,+55]mm · 3 of 26 slices shown]
[im 5/26]
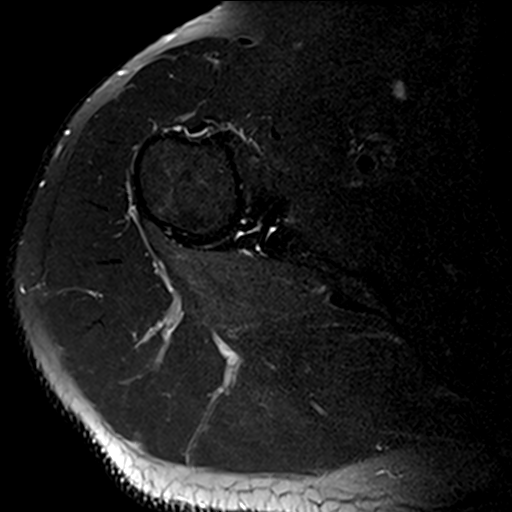
[im 13/26]
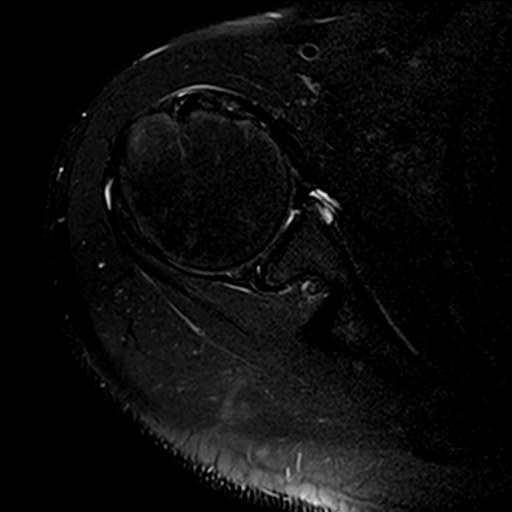
[im 21/26]
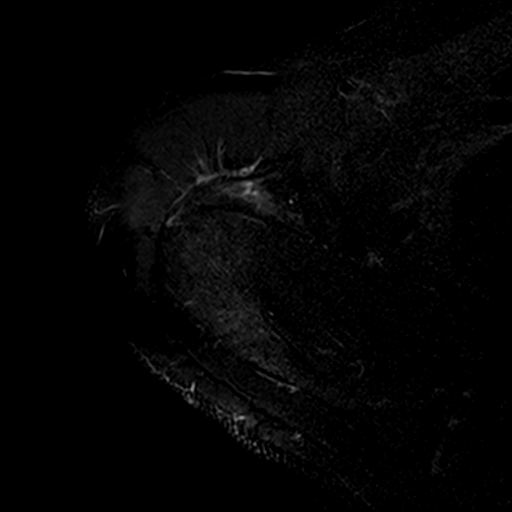

[Series 4: t2fs coronal · oblique · 3.0mm · 0.31mm/px · 3 of 20 slices shown]
[im 4/20]
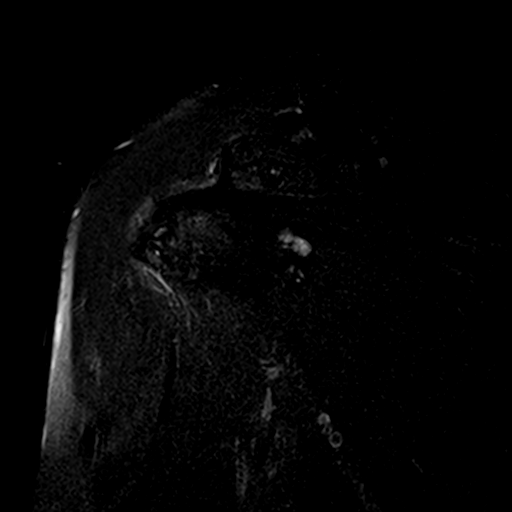
[im 12/20]
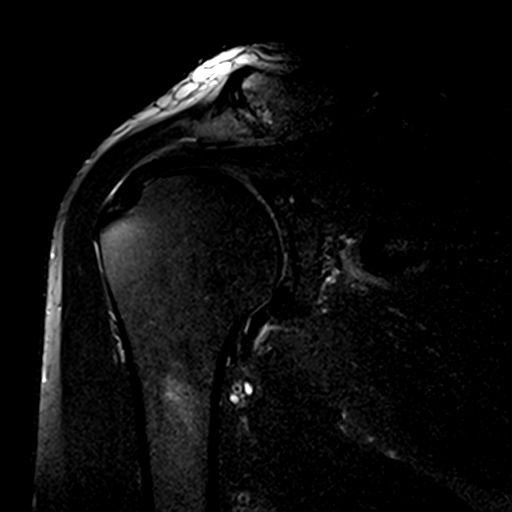
[im 20/20]
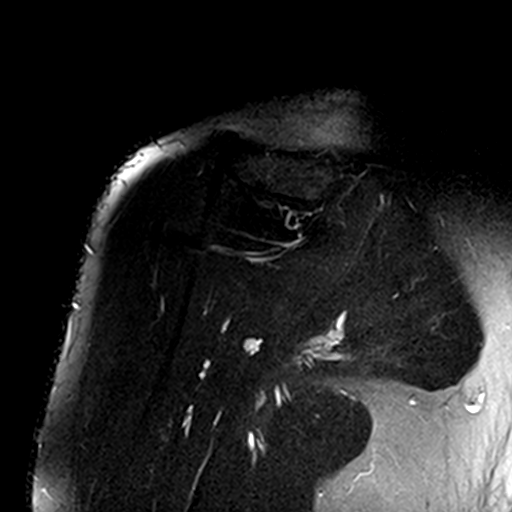

[Series 5: PD · oblique · 3.0mm · 0.31mm/px · 6 of 20 slices shown]
[im 1/20]
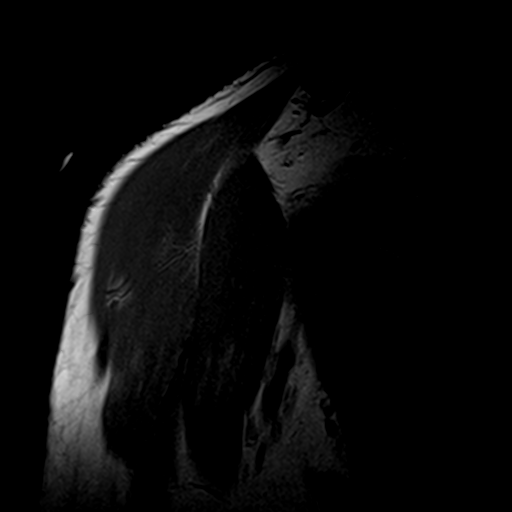
[im 4/20]
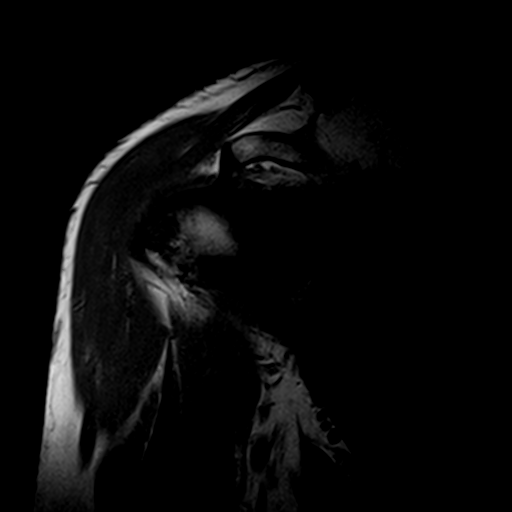
[im 8/20]
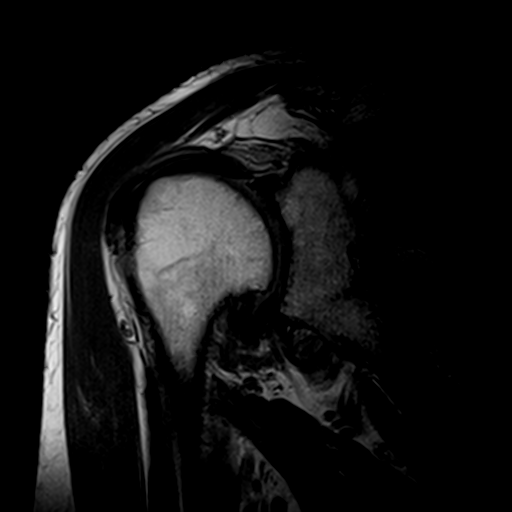
[im 12/20]
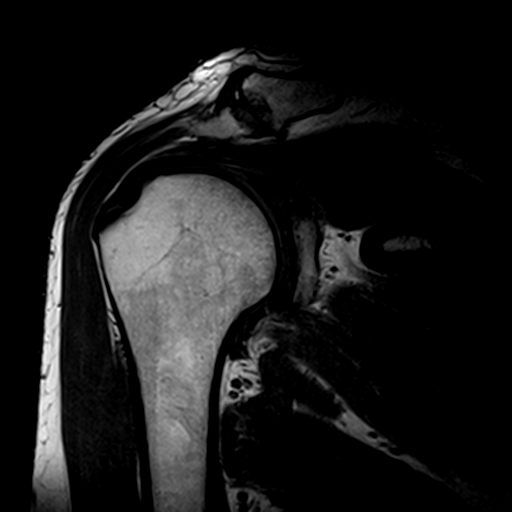
[im 16/20]
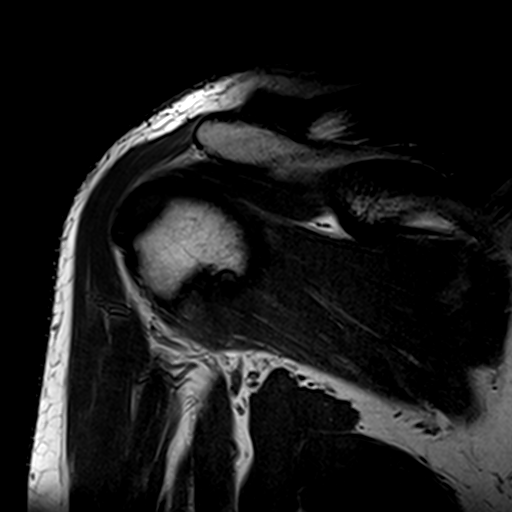
[im 20/20]
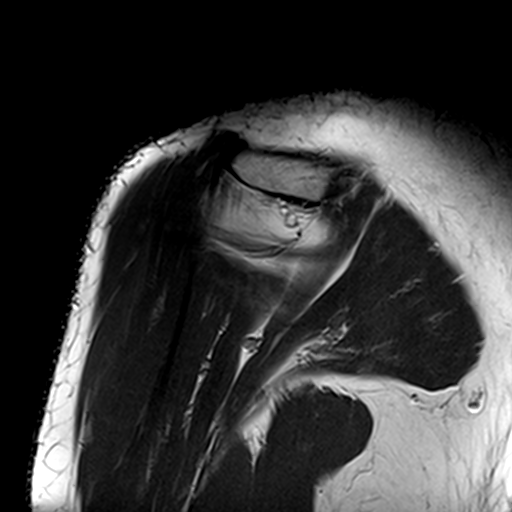

[Series 6: T1 · oblique · 3.0mm · 0.31mm/px · 7 of 26 slices shown]
[im 1/26]
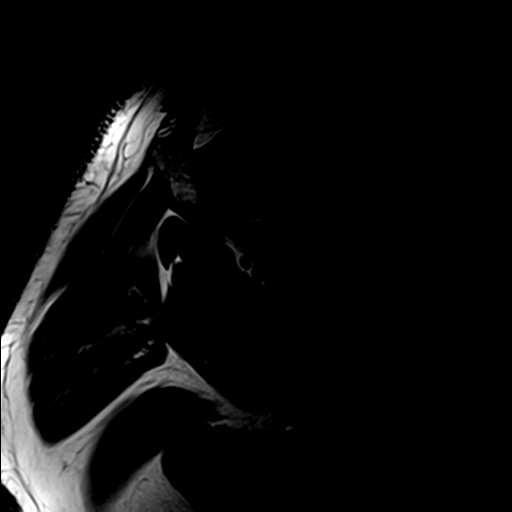
[im 5/26]
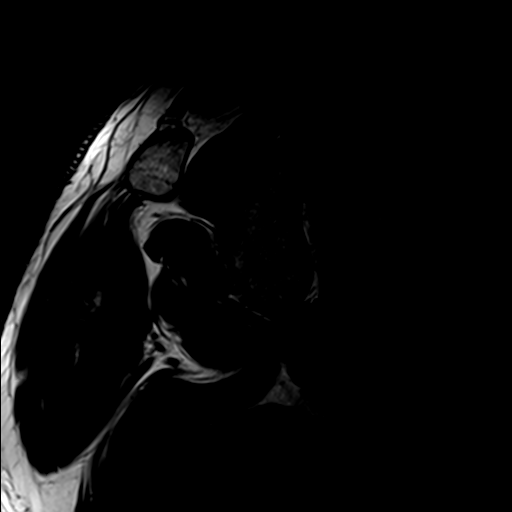
[im 9/26]
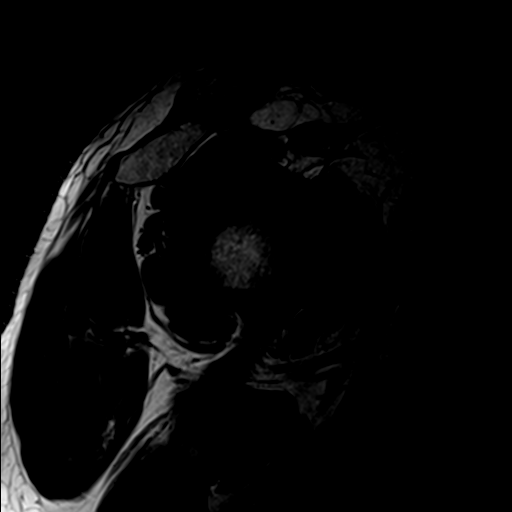
[im 13/26]
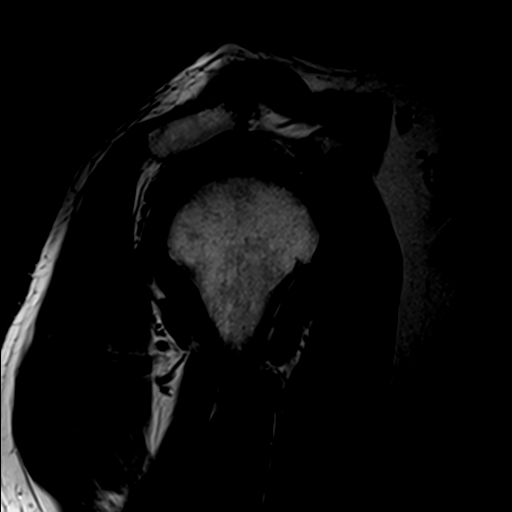
[im 17/26]
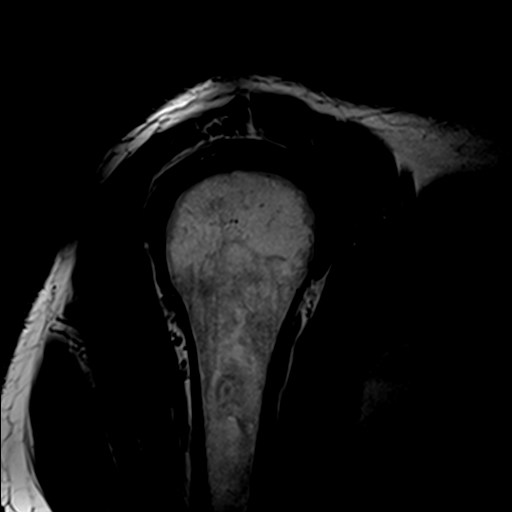
[im 21/26]
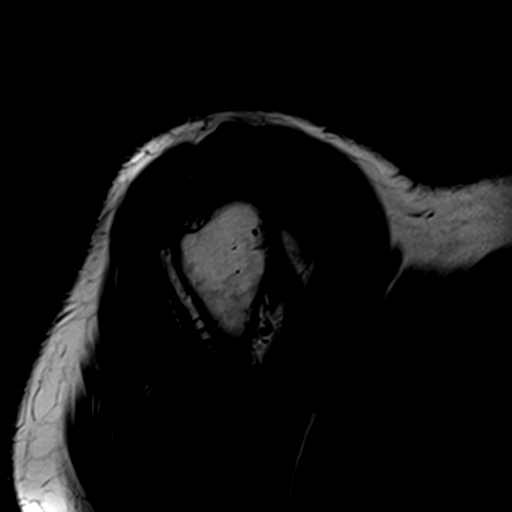
[im 26/26]
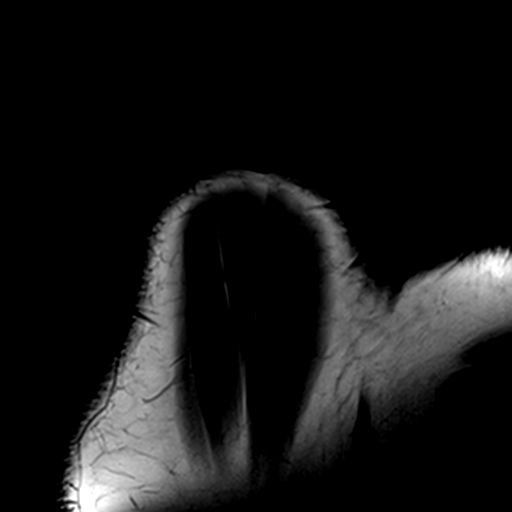

[19 of 40 positions shown; findings below may reference images not displayed]

FINDINGS: Rotator cuff: Mild to moderate rotator cuff tendinopathy appears
most notable in the infraspinatus. Small interstitial tear in the
infraspinatus at the greater tuberosity is identified. No
full-thickness tear or retracted tendon.

Muscles:  No atrophy or focal lesion.

Biceps long head: Tendinopathy of the intra-articular segment
without tear is identified.

Acromioclavicular Joint: Moderate to moderately severe degenerative
change is seen.

Glenohumeral Joint: Mild to moderate degenerative changes noted.
Subchondral cyst in the superior glenoid is identified.

Labrum: The superior and posterior labrum appear degenerated. No
tear is seen.

Bones: No fracture or worrisome marrow lesion. The acromion is type
1. There is some fluid in the subacromial/subdeltoid bursa.
IMPRESSION: Rotator cuff tendinopathy appears most notable in the infraspinatus
where a very small interstitial tear is seen at the greater
tuberosity. No full-thickness tear or retracted tendon.

Moderate to moderately severe acromioclavicular osteoarthritis
appears advanced for age. There is also mild to moderate
glenohumeral degenerative change.

Small volume of subacromial/subdeltoid fluid consistent with
bursitis.

## 2017-08-21 DIAGNOSIS — M1000 Idiopathic gout, unspecified site: Secondary | ICD-10-CM | POA: Diagnosis not present

## 2017-08-21 DIAGNOSIS — E119 Type 2 diabetes mellitus without complications: Secondary | ICD-10-CM | POA: Diagnosis not present

## 2017-08-21 DIAGNOSIS — E782 Mixed hyperlipidemia: Secondary | ICD-10-CM | POA: Diagnosis not present

## 2017-08-21 DIAGNOSIS — I10 Essential (primary) hypertension: Secondary | ICD-10-CM | POA: Diagnosis not present

## 2017-08-21 DIAGNOSIS — E6609 Other obesity due to excess calories: Secondary | ICD-10-CM | POA: Diagnosis not present

## 2017-12-06 DIAGNOSIS — E6609 Other obesity due to excess calories: Secondary | ICD-10-CM | POA: Diagnosis not present

## 2017-12-06 DIAGNOSIS — I10 Essential (primary) hypertension: Secondary | ICD-10-CM | POA: Diagnosis not present

## 2017-12-06 DIAGNOSIS — M1000 Idiopathic gout, unspecified site: Secondary | ICD-10-CM | POA: Diagnosis not present

## 2017-12-06 DIAGNOSIS — E782 Mixed hyperlipidemia: Secondary | ICD-10-CM | POA: Diagnosis not present

## 2018-03-29 DIAGNOSIS — I10 Essential (primary) hypertension: Secondary | ICD-10-CM | POA: Diagnosis not present

## 2018-03-29 DIAGNOSIS — E1165 Type 2 diabetes mellitus with hyperglycemia: Secondary | ICD-10-CM | POA: Diagnosis not present

## 2018-03-29 DIAGNOSIS — E782 Mixed hyperlipidemia: Secondary | ICD-10-CM | POA: Diagnosis not present

## 2018-03-29 DIAGNOSIS — E7800 Pure hypercholesterolemia, unspecified: Secondary | ICD-10-CM | POA: Diagnosis not present

## 2018-04-02 DIAGNOSIS — E782 Mixed hyperlipidemia: Secondary | ICD-10-CM | POA: Diagnosis not present

## 2018-04-02 DIAGNOSIS — Z23 Encounter for immunization: Secondary | ICD-10-CM | POA: Diagnosis not present

## 2018-04-02 DIAGNOSIS — M1000 Idiopathic gout, unspecified site: Secondary | ICD-10-CM | POA: Diagnosis not present

## 2018-04-02 DIAGNOSIS — I10 Essential (primary) hypertension: Secondary | ICD-10-CM | POA: Diagnosis not present

## 2018-04-02 DIAGNOSIS — E119 Type 2 diabetes mellitus without complications: Secondary | ICD-10-CM | POA: Diagnosis not present

## 2018-05-02 DIAGNOSIS — Z6832 Body mass index (BMI) 32.0-32.9, adult: Secondary | ICD-10-CM | POA: Diagnosis not present

## 2018-05-02 DIAGNOSIS — M25531 Pain in right wrist: Secondary | ICD-10-CM | POA: Diagnosis not present

## 2018-05-28 DIAGNOSIS — M25531 Pain in right wrist: Secondary | ICD-10-CM | POA: Diagnosis not present

## 2018-07-20 DIAGNOSIS — E7800 Pure hypercholesterolemia, unspecified: Secondary | ICD-10-CM | POA: Diagnosis not present

## 2018-07-20 DIAGNOSIS — E782 Mixed hyperlipidemia: Secondary | ICD-10-CM | POA: Diagnosis not present

## 2018-07-20 DIAGNOSIS — E119 Type 2 diabetes mellitus without complications: Secondary | ICD-10-CM | POA: Diagnosis not present

## 2018-07-20 DIAGNOSIS — E1165 Type 2 diabetes mellitus with hyperglycemia: Secondary | ICD-10-CM | POA: Diagnosis not present

## 2018-07-23 DIAGNOSIS — M1000 Idiopathic gout, unspecified site: Secondary | ICD-10-CM | POA: Diagnosis not present

## 2018-07-23 DIAGNOSIS — G5603 Carpal tunnel syndrome, bilateral upper limbs: Secondary | ICD-10-CM | POA: Diagnosis not present

## 2018-07-23 DIAGNOSIS — E782 Mixed hyperlipidemia: Secondary | ICD-10-CM | POA: Diagnosis not present

## 2018-07-23 DIAGNOSIS — I10 Essential (primary) hypertension: Secondary | ICD-10-CM | POA: Diagnosis not present

## 2018-12-05 DIAGNOSIS — E1122 Type 2 diabetes mellitus with diabetic chronic kidney disease: Secondary | ICD-10-CM | POA: Diagnosis not present

## 2018-12-05 DIAGNOSIS — N183 Chronic kidney disease, stage 3 (moderate): Secondary | ICD-10-CM | POA: Diagnosis not present

## 2018-12-05 DIAGNOSIS — E782 Mixed hyperlipidemia: Secondary | ICD-10-CM | POA: Diagnosis not present

## 2018-12-05 DIAGNOSIS — E7800 Pure hypercholesterolemia, unspecified: Secondary | ICD-10-CM | POA: Diagnosis not present

## 2018-12-05 DIAGNOSIS — E781 Pure hyperglyceridemia: Secondary | ICD-10-CM | POA: Diagnosis not present

## 2018-12-10 DIAGNOSIS — I10 Essential (primary) hypertension: Secondary | ICD-10-CM | POA: Diagnosis not present

## 2018-12-10 DIAGNOSIS — M1000 Idiopathic gout, unspecified site: Secondary | ICD-10-CM | POA: Diagnosis not present

## 2018-12-10 DIAGNOSIS — E782 Mixed hyperlipidemia: Secondary | ICD-10-CM | POA: Diagnosis not present

## 2018-12-10 DIAGNOSIS — M19042 Primary osteoarthritis, left hand: Secondary | ICD-10-CM | POA: Diagnosis not present

## 2019-04-03 DIAGNOSIS — Z6832 Body mass index (BMI) 32.0-32.9, adult: Secondary | ICD-10-CM | POA: Diagnosis not present

## 2019-04-03 DIAGNOSIS — M545 Low back pain: Secondary | ICD-10-CM | POA: Diagnosis not present

## 2019-04-03 DIAGNOSIS — M62838 Other muscle spasm: Secondary | ICD-10-CM | POA: Diagnosis not present

## 2019-04-05 DIAGNOSIS — M25562 Pain in left knee: Secondary | ICD-10-CM | POA: Diagnosis not present

## 2019-04-05 DIAGNOSIS — M25561 Pain in right knee: Secondary | ICD-10-CM | POA: Diagnosis not present

## 2019-04-05 DIAGNOSIS — M170 Bilateral primary osteoarthritis of knee: Secondary | ICD-10-CM | POA: Diagnosis not present

## 2019-04-05 DIAGNOSIS — M1711 Unilateral primary osteoarthritis, right knee: Secondary | ICD-10-CM | POA: Diagnosis not present

## 2019-04-05 DIAGNOSIS — M1712 Unilateral primary osteoarthritis, left knee: Secondary | ICD-10-CM | POA: Diagnosis not present

## 2019-04-24 DIAGNOSIS — N183 Chronic kidney disease, stage 3 unspecified: Secondary | ICD-10-CM | POA: Diagnosis not present

## 2019-04-24 DIAGNOSIS — E781 Pure hyperglyceridemia: Secondary | ICD-10-CM | POA: Diagnosis not present

## 2019-04-24 DIAGNOSIS — E1122 Type 2 diabetes mellitus with diabetic chronic kidney disease: Secondary | ICD-10-CM | POA: Diagnosis not present

## 2019-04-24 DIAGNOSIS — E1165 Type 2 diabetes mellitus with hyperglycemia: Secondary | ICD-10-CM | POA: Diagnosis not present

## 2019-04-24 DIAGNOSIS — E1121 Type 2 diabetes mellitus with diabetic nephropathy: Secondary | ICD-10-CM | POA: Diagnosis not present

## 2019-04-29 DIAGNOSIS — Z23 Encounter for immunization: Secondary | ICD-10-CM | POA: Diagnosis not present

## 2019-04-29 DIAGNOSIS — D696 Thrombocytopenia, unspecified: Secondary | ICD-10-CM | POA: Diagnosis not present

## 2019-04-29 DIAGNOSIS — E782 Mixed hyperlipidemia: Secondary | ICD-10-CM | POA: Diagnosis not present

## 2019-04-29 DIAGNOSIS — I10 Essential (primary) hypertension: Secondary | ICD-10-CM | POA: Diagnosis not present

## 2019-04-29 DIAGNOSIS — M1000 Idiopathic gout, unspecified site: Secondary | ICD-10-CM | POA: Diagnosis not present

## 2019-04-29 DIAGNOSIS — E1122 Type 2 diabetes mellitus with diabetic chronic kidney disease: Secondary | ICD-10-CM | POA: Diagnosis not present

## 2019-05-01 DIAGNOSIS — D696 Thrombocytopenia, unspecified: Secondary | ICD-10-CM | POA: Diagnosis not present

## 2019-05-17 DIAGNOSIS — D61818 Other pancytopenia: Secondary | ICD-10-CM | POA: Diagnosis not present

## 2019-05-17 DIAGNOSIS — D696 Thrombocytopenia, unspecified: Secondary | ICD-10-CM | POA: Diagnosis not present

## 2019-05-17 DIAGNOSIS — E119 Type 2 diabetes mellitus without complications: Secondary | ICD-10-CM | POA: Diagnosis not present

## 2019-05-23 DIAGNOSIS — Z9049 Acquired absence of other specified parts of digestive tract: Secondary | ICD-10-CM | POA: Diagnosis not present

## 2019-05-23 DIAGNOSIS — R161 Splenomegaly, not elsewhere classified: Secondary | ICD-10-CM | POA: Diagnosis not present

## 2019-05-23 DIAGNOSIS — D696 Thrombocytopenia, unspecified: Secondary | ICD-10-CM | POA: Diagnosis not present

## 2019-05-23 DIAGNOSIS — E119 Type 2 diabetes mellitus without complications: Secondary | ICD-10-CM | POA: Diagnosis not present

## 2019-05-30 DIAGNOSIS — D61818 Other pancytopenia: Secondary | ICD-10-CM | POA: Diagnosis not present

## 2019-05-30 DIAGNOSIS — D696 Thrombocytopenia, unspecified: Secondary | ICD-10-CM | POA: Diagnosis not present

## 2019-05-30 DIAGNOSIS — I129 Hypertensive chronic kidney disease with stage 1 through stage 4 chronic kidney disease, or unspecified chronic kidney disease: Secondary | ICD-10-CM | POA: Diagnosis not present

## 2019-05-30 DIAGNOSIS — D899 Disorder involving the immune mechanism, unspecified: Secondary | ICD-10-CM | POA: Diagnosis not present

## 2019-05-30 DIAGNOSIS — Z9049 Acquired absence of other specified parts of digestive tract: Secondary | ICD-10-CM | POA: Diagnosis not present

## 2019-05-30 DIAGNOSIS — Z7984 Long term (current) use of oral hypoglycemic drugs: Secondary | ICD-10-CM | POA: Diagnosis not present

## 2019-05-30 DIAGNOSIS — E1122 Type 2 diabetes mellitus with diabetic chronic kidney disease: Secondary | ICD-10-CM | POA: Diagnosis not present

## 2019-05-30 DIAGNOSIS — M109 Gout, unspecified: Secondary | ICD-10-CM | POA: Diagnosis not present

## 2019-05-30 DIAGNOSIS — Z7982 Long term (current) use of aspirin: Secondary | ICD-10-CM | POA: Diagnosis not present

## 2019-05-30 DIAGNOSIS — D6959 Other secondary thrombocytopenia: Secondary | ICD-10-CM | POA: Diagnosis not present

## 2019-05-30 DIAGNOSIS — E559 Vitamin D deficiency, unspecified: Secondary | ICD-10-CM | POA: Diagnosis not present

## 2019-05-30 DIAGNOSIS — N189 Chronic kidney disease, unspecified: Secondary | ICD-10-CM | POA: Diagnosis not present

## 2019-05-30 DIAGNOSIS — E785 Hyperlipidemia, unspecified: Secondary | ICD-10-CM | POA: Diagnosis not present

## 2019-05-30 DIAGNOSIS — K7581 Nonalcoholic steatohepatitis (NASH): Secondary | ICD-10-CM | POA: Diagnosis not present

## 2019-05-30 DIAGNOSIS — Z79899 Other long term (current) drug therapy: Secondary | ICD-10-CM | POA: Diagnosis not present

## 2019-06-13 DIAGNOSIS — I10 Essential (primary) hypertension: Secondary | ICD-10-CM | POA: Diagnosis not present

## 2019-06-13 DIAGNOSIS — N183 Chronic kidney disease, stage 3 unspecified: Secondary | ICD-10-CM | POA: Diagnosis not present

## 2019-06-13 DIAGNOSIS — E1122 Type 2 diabetes mellitus with diabetic chronic kidney disease: Secondary | ICD-10-CM | POA: Diagnosis not present

## 2019-06-13 DIAGNOSIS — D696 Thrombocytopenia, unspecified: Secondary | ICD-10-CM | POA: Diagnosis not present

## 2019-06-13 DIAGNOSIS — D6959 Other secondary thrombocytopenia: Secondary | ICD-10-CM | POA: Diagnosis not present

## 2019-07-06 DIAGNOSIS — N183 Chronic kidney disease, stage 3 unspecified: Secondary | ICD-10-CM | POA: Diagnosis not present

## 2019-07-06 DIAGNOSIS — I10 Essential (primary) hypertension: Secondary | ICD-10-CM | POA: Diagnosis not present

## 2019-07-06 DIAGNOSIS — K7581 Nonalcoholic steatohepatitis (NASH): Secondary | ICD-10-CM | POA: Diagnosis not present

## 2019-07-06 DIAGNOSIS — D6949 Other primary thrombocytopenia: Secondary | ICD-10-CM | POA: Diagnosis not present

## 2019-07-12 DIAGNOSIS — M25561 Pain in right knee: Secondary | ICD-10-CM | POA: Diagnosis not present

## 2019-07-12 DIAGNOSIS — D696 Thrombocytopenia, unspecified: Secondary | ICD-10-CM | POA: Diagnosis not present

## 2019-07-12 DIAGNOSIS — M1711 Unilateral primary osteoarthritis, right knee: Secondary | ICD-10-CM | POA: Diagnosis not present

## 2019-08-14 DIAGNOSIS — E782 Mixed hyperlipidemia: Secondary | ICD-10-CM | POA: Diagnosis not present

## 2019-08-14 DIAGNOSIS — I10 Essential (primary) hypertension: Secondary | ICD-10-CM | POA: Diagnosis not present

## 2019-08-14 DIAGNOSIS — E1122 Type 2 diabetes mellitus with diabetic chronic kidney disease: Secondary | ICD-10-CM | POA: Diagnosis not present

## 2019-08-14 DIAGNOSIS — N183 Chronic kidney disease, stage 3 unspecified: Secondary | ICD-10-CM | POA: Diagnosis not present

## 2019-08-19 DIAGNOSIS — I10 Essential (primary) hypertension: Secondary | ICD-10-CM | POA: Diagnosis not present

## 2019-08-19 DIAGNOSIS — E782 Mixed hyperlipidemia: Secondary | ICD-10-CM | POA: Diagnosis not present

## 2019-08-19 DIAGNOSIS — M1000 Idiopathic gout, unspecified site: Secondary | ICD-10-CM | POA: Diagnosis not present

## 2019-08-19 DIAGNOSIS — G5603 Carpal tunnel syndrome, bilateral upper limbs: Secondary | ICD-10-CM | POA: Diagnosis not present

## 2019-08-28 DIAGNOSIS — M170 Bilateral primary osteoarthritis of knee: Secondary | ICD-10-CM | POA: Diagnosis not present

## 2019-08-28 DIAGNOSIS — Z6833 Body mass index (BMI) 33.0-33.9, adult: Secondary | ICD-10-CM | POA: Diagnosis not present

## 2019-10-26 DIAGNOSIS — J0101 Acute recurrent maxillary sinusitis: Secondary | ICD-10-CM | POA: Diagnosis not present

## 2019-10-26 DIAGNOSIS — Z20828 Contact with and (suspected) exposure to other viral communicable diseases: Secondary | ICD-10-CM | POA: Diagnosis not present

## 2020-01-29 DIAGNOSIS — D696 Thrombocytopenia, unspecified: Secondary | ICD-10-CM | POA: Diagnosis not present

## 2020-02-03 DIAGNOSIS — D6949 Other primary thrombocytopenia: Secondary | ICD-10-CM | POA: Diagnosis not present

## 2020-02-03 DIAGNOSIS — E6609 Other obesity due to excess calories: Secondary | ICD-10-CM | POA: Diagnosis not present

## 2020-02-03 DIAGNOSIS — M1711 Unilateral primary osteoarthritis, right knee: Secondary | ICD-10-CM | POA: Diagnosis not present

## 2020-02-03 DIAGNOSIS — E1122 Type 2 diabetes mellitus with diabetic chronic kidney disease: Secondary | ICD-10-CM | POA: Diagnosis not present

## 2020-02-03 DIAGNOSIS — Z23 Encounter for immunization: Secondary | ICD-10-CM | POA: Diagnosis not present

## 2020-02-03 DIAGNOSIS — E782 Mixed hyperlipidemia: Secondary | ICD-10-CM | POA: Diagnosis not present

## 2020-02-03 DIAGNOSIS — I10 Essential (primary) hypertension: Secondary | ICD-10-CM | POA: Diagnosis not present

## 2020-02-03 DIAGNOSIS — N183 Chronic kidney disease, stage 3 unspecified: Secondary | ICD-10-CM | POA: Diagnosis not present

## 2020-02-07 DIAGNOSIS — Z5181 Encounter for therapeutic drug level monitoring: Secondary | ICD-10-CM | POA: Diagnosis not present

## 2020-02-07 DIAGNOSIS — M1711 Unilateral primary osteoarthritis, right knee: Secondary | ICD-10-CM | POA: Diagnosis not present

## 2020-02-07 DIAGNOSIS — M25561 Pain in right knee: Secondary | ICD-10-CM | POA: Diagnosis not present

## 2020-02-07 DIAGNOSIS — I10 Essential (primary) hypertension: Secondary | ICD-10-CM | POA: Diagnosis not present

## 2020-02-07 DIAGNOSIS — Z01812 Encounter for preprocedural laboratory examination: Secondary | ICD-10-CM | POA: Diagnosis not present

## 2020-02-07 DIAGNOSIS — Z01818 Encounter for other preprocedural examination: Secondary | ICD-10-CM | POA: Diagnosis not present

## 2020-02-07 DIAGNOSIS — Z9189 Other specified personal risk factors, not elsewhere classified: Secondary | ICD-10-CM | POA: Diagnosis not present

## 2020-02-07 DIAGNOSIS — Z7901 Long term (current) use of anticoagulants: Secondary | ICD-10-CM | POA: Diagnosis not present

## 2020-02-07 DIAGNOSIS — D696 Thrombocytopenia, unspecified: Secondary | ICD-10-CM | POA: Diagnosis not present

## 2020-02-07 DIAGNOSIS — E119 Type 2 diabetes mellitus without complications: Secondary | ICD-10-CM | POA: Diagnosis not present

## 2020-02-07 DIAGNOSIS — K7581 Nonalcoholic steatohepatitis (NASH): Secondary | ICD-10-CM | POA: Diagnosis not present

## 2020-02-07 DIAGNOSIS — M11261 Other chondrocalcinosis, right knee: Secondary | ICD-10-CM | POA: Diagnosis not present

## 2020-02-07 DIAGNOSIS — Z8619 Personal history of other infectious and parasitic diseases: Secondary | ICD-10-CM | POA: Diagnosis not present

## 2020-02-07 DIAGNOSIS — M21061 Valgus deformity, not elsewhere classified, right knee: Secondary | ICD-10-CM | POA: Diagnosis not present

## 2020-02-21 DIAGNOSIS — E538 Deficiency of other specified B group vitamins: Secondary | ICD-10-CM | POA: Diagnosis not present

## 2020-02-21 DIAGNOSIS — Z01818 Encounter for other preprocedural examination: Secondary | ICD-10-CM | POA: Diagnosis not present

## 2020-02-21 DIAGNOSIS — Z9049 Acquired absence of other specified parts of digestive tract: Secondary | ICD-10-CM | POA: Diagnosis not present

## 2020-02-21 DIAGNOSIS — R161 Splenomegaly, not elsewhere classified: Secondary | ICD-10-CM | POA: Diagnosis not present

## 2020-02-21 DIAGNOSIS — I129 Hypertensive chronic kidney disease with stage 1 through stage 4 chronic kidney disease, or unspecified chronic kidney disease: Secondary | ICD-10-CM | POA: Diagnosis not present

## 2020-02-21 DIAGNOSIS — D696 Thrombocytopenia, unspecified: Secondary | ICD-10-CM | POA: Diagnosis not present

## 2020-02-21 DIAGNOSIS — E785 Hyperlipidemia, unspecified: Secondary | ICD-10-CM | POA: Diagnosis not present

## 2020-02-21 DIAGNOSIS — D6959 Other secondary thrombocytopenia: Secondary | ICD-10-CM | POA: Diagnosis not present

## 2020-02-21 DIAGNOSIS — E119 Type 2 diabetes mellitus without complications: Secondary | ICD-10-CM | POA: Diagnosis not present

## 2020-02-21 DIAGNOSIS — E559 Vitamin D deficiency, unspecified: Secondary | ICD-10-CM | POA: Diagnosis not present

## 2020-02-21 DIAGNOSIS — E1122 Type 2 diabetes mellitus with diabetic chronic kidney disease: Secondary | ICD-10-CM | POA: Diagnosis not present

## 2020-02-21 DIAGNOSIS — M1990 Unspecified osteoarthritis, unspecified site: Secondary | ICD-10-CM | POA: Diagnosis not present

## 2020-02-21 DIAGNOSIS — Z7984 Long term (current) use of oral hypoglycemic drugs: Secondary | ICD-10-CM | POA: Diagnosis not present

## 2020-02-21 DIAGNOSIS — N189 Chronic kidney disease, unspecified: Secondary | ICD-10-CM | POA: Diagnosis not present

## 2020-02-21 DIAGNOSIS — M109 Gout, unspecified: Secondary | ICD-10-CM | POA: Diagnosis not present

## 2020-02-21 DIAGNOSIS — D61818 Other pancytopenia: Secondary | ICD-10-CM | POA: Diagnosis not present

## 2020-02-21 DIAGNOSIS — K7581 Nonalcoholic steatohepatitis (NASH): Secondary | ICD-10-CM | POA: Diagnosis not present

## 2020-02-21 DIAGNOSIS — Z79899 Other long term (current) drug therapy: Secondary | ICD-10-CM | POA: Diagnosis not present

## 2020-02-21 DIAGNOSIS — Z96659 Presence of unspecified artificial knee joint: Secondary | ICD-10-CM | POA: Diagnosis not present

## 2020-03-05 DIAGNOSIS — Z01812 Encounter for preprocedural laboratory examination: Secondary | ICD-10-CM | POA: Diagnosis not present

## 2020-03-06 DIAGNOSIS — M1711 Unilateral primary osteoarthritis, right knee: Secondary | ICD-10-CM | POA: Insufficient documentation

## 2020-03-07 DIAGNOSIS — N189 Chronic kidney disease, unspecified: Secondary | ICD-10-CM | POA: Diagnosis not present

## 2020-03-07 DIAGNOSIS — Z9049 Acquired absence of other specified parts of digestive tract: Secondary | ICD-10-CM | POA: Diagnosis not present

## 2020-03-07 DIAGNOSIS — Z7984 Long term (current) use of oral hypoglycemic drugs: Secondary | ICD-10-CM | POA: Diagnosis not present

## 2020-03-07 DIAGNOSIS — I129 Hypertensive chronic kidney disease with stage 1 through stage 4 chronic kidney disease, or unspecified chronic kidney disease: Secondary | ICD-10-CM | POA: Diagnosis not present

## 2020-03-07 DIAGNOSIS — M24661 Ankylosis, right knee: Secondary | ICD-10-CM | POA: Diagnosis not present

## 2020-03-07 DIAGNOSIS — R944 Abnormal results of kidney function studies: Secondary | ICD-10-CM | POA: Diagnosis not present

## 2020-03-07 DIAGNOSIS — Z96651 Presence of right artificial knee joint: Secondary | ICD-10-CM | POA: Diagnosis not present

## 2020-03-07 DIAGNOSIS — E785 Hyperlipidemia, unspecified: Secondary | ICD-10-CM | POA: Diagnosis not present

## 2020-03-07 DIAGNOSIS — Z86718 Personal history of other venous thrombosis and embolism: Secondary | ICD-10-CM | POA: Diagnosis not present

## 2020-03-07 DIAGNOSIS — Z880 Allergy status to penicillin: Secondary | ICD-10-CM | POA: Diagnosis not present

## 2020-03-07 DIAGNOSIS — E1122 Type 2 diabetes mellitus with diabetic chronic kidney disease: Secondary | ICD-10-CM | POA: Diagnosis not present

## 2020-03-07 DIAGNOSIS — M1711 Unilateral primary osteoarthritis, right knee: Secondary | ICD-10-CM | POA: Diagnosis not present

## 2020-03-07 DIAGNOSIS — I10 Essential (primary) hypertension: Secondary | ICD-10-CM | POA: Diagnosis not present

## 2020-03-07 DIAGNOSIS — R7989 Other specified abnormal findings of blood chemistry: Secondary | ICD-10-CM | POA: Diagnosis not present

## 2020-03-07 DIAGNOSIS — E119 Type 2 diabetes mellitus without complications: Secondary | ICD-10-CM | POA: Diagnosis not present

## 2020-03-07 DIAGNOSIS — D696 Thrombocytopenia, unspecified: Secondary | ICD-10-CM | POA: Diagnosis not present

## 2020-03-13 DIAGNOSIS — E1122 Type 2 diabetes mellitus with diabetic chronic kidney disease: Secondary | ICD-10-CM | POA: Diagnosis not present

## 2020-03-13 DIAGNOSIS — N189 Chronic kidney disease, unspecified: Secondary | ICD-10-CM | POA: Diagnosis not present

## 2020-03-13 DIAGNOSIS — E785 Hyperlipidemia, unspecified: Secondary | ICD-10-CM | POA: Diagnosis not present

## 2020-03-13 DIAGNOSIS — B958 Unspecified staphylococcus as the cause of diseases classified elsewhere: Secondary | ICD-10-CM | POA: Diagnosis not present

## 2020-03-13 DIAGNOSIS — Z7982 Long term (current) use of aspirin: Secondary | ICD-10-CM | POA: Diagnosis not present

## 2020-03-13 DIAGNOSIS — Z7984 Long term (current) use of oral hypoglycemic drugs: Secondary | ICD-10-CM | POA: Diagnosis not present

## 2020-03-13 DIAGNOSIS — Z452 Encounter for adjustment and management of vascular access device: Secondary | ICD-10-CM | POA: Diagnosis not present

## 2020-03-13 DIAGNOSIS — Z8619 Personal history of other infectious and parasitic diseases: Secondary | ICD-10-CM | POA: Diagnosis not present

## 2020-03-13 DIAGNOSIS — I129 Hypertensive chronic kidney disease with stage 1 through stage 4 chronic kidney disease, or unspecified chronic kidney disease: Secondary | ICD-10-CM | POA: Diagnosis not present

## 2020-03-13 DIAGNOSIS — Z9049 Acquired absence of other specified parts of digestive tract: Secondary | ICD-10-CM | POA: Diagnosis not present

## 2020-03-13 DIAGNOSIS — Z880 Allergy status to penicillin: Secondary | ICD-10-CM | POA: Diagnosis not present

## 2020-03-13 DIAGNOSIS — Z96651 Presence of right artificial knee joint: Secondary | ICD-10-CM | POA: Diagnosis not present

## 2020-03-13 DIAGNOSIS — Z79899 Other long term (current) drug therapy: Secondary | ICD-10-CM | POA: Diagnosis not present

## 2020-03-13 DIAGNOSIS — D696 Thrombocytopenia, unspecified: Secondary | ICD-10-CM | POA: Diagnosis not present

## 2020-03-14 DIAGNOSIS — Z471 Aftercare following joint replacement surgery: Secondary | ICD-10-CM | POA: Diagnosis not present

## 2020-03-14 DIAGNOSIS — M25561 Pain in right knee: Secondary | ICD-10-CM | POA: Diagnosis not present

## 2020-03-14 DIAGNOSIS — Z96651 Presence of right artificial knee joint: Secondary | ICD-10-CM | POA: Diagnosis not present

## 2020-03-14 DIAGNOSIS — R2689 Other abnormalities of gait and mobility: Secondary | ICD-10-CM | POA: Diagnosis not present

## 2020-03-14 DIAGNOSIS — B958 Unspecified staphylococcus as the cause of diseases classified elsewhere: Secondary | ICD-10-CM | POA: Diagnosis not present

## 2020-03-15 DIAGNOSIS — Z96651 Presence of right artificial knee joint: Secondary | ICD-10-CM | POA: Diagnosis not present

## 2020-03-15 DIAGNOSIS — R2689 Other abnormalities of gait and mobility: Secondary | ICD-10-CM | POA: Diagnosis not present

## 2020-03-15 DIAGNOSIS — Z471 Aftercare following joint replacement surgery: Secondary | ICD-10-CM | POA: Diagnosis not present

## 2020-03-15 DIAGNOSIS — B958 Unspecified staphylococcus as the cause of diseases classified elsewhere: Secondary | ICD-10-CM | POA: Diagnosis not present

## 2020-03-15 DIAGNOSIS — M25561 Pain in right knee: Secondary | ICD-10-CM | POA: Diagnosis not present

## 2020-03-16 DIAGNOSIS — B958 Unspecified staphylococcus as the cause of diseases classified elsewhere: Secondary | ICD-10-CM | POA: Diagnosis not present

## 2020-03-17 DIAGNOSIS — B958 Unspecified staphylococcus as the cause of diseases classified elsewhere: Secondary | ICD-10-CM | POA: Diagnosis not present

## 2020-03-18 DIAGNOSIS — R2689 Other abnormalities of gait and mobility: Secondary | ICD-10-CM | POA: Diagnosis not present

## 2020-03-18 DIAGNOSIS — Z96651 Presence of right artificial knee joint: Secondary | ICD-10-CM | POA: Diagnosis not present

## 2020-03-18 DIAGNOSIS — M25561 Pain in right knee: Secondary | ICD-10-CM | POA: Diagnosis not present

## 2020-03-18 DIAGNOSIS — B958 Unspecified staphylococcus as the cause of diseases classified elsewhere: Secondary | ICD-10-CM | POA: Diagnosis not present

## 2020-03-18 DIAGNOSIS — Z471 Aftercare following joint replacement surgery: Secondary | ICD-10-CM | POA: Diagnosis not present

## 2020-03-19 DIAGNOSIS — B958 Unspecified staphylococcus as the cause of diseases classified elsewhere: Secondary | ICD-10-CM | POA: Diagnosis not present

## 2020-03-20 DIAGNOSIS — R2689 Other abnormalities of gait and mobility: Secondary | ICD-10-CM | POA: Diagnosis not present

## 2020-03-20 DIAGNOSIS — Z471 Aftercare following joint replacement surgery: Secondary | ICD-10-CM | POA: Diagnosis not present

## 2020-03-20 DIAGNOSIS — B958 Unspecified staphylococcus as the cause of diseases classified elsewhere: Secondary | ICD-10-CM | POA: Diagnosis not present

## 2020-03-20 DIAGNOSIS — Z96651 Presence of right artificial knee joint: Secondary | ICD-10-CM | POA: Diagnosis not present

## 2020-03-20 DIAGNOSIS — M25561 Pain in right knee: Secondary | ICD-10-CM | POA: Diagnosis not present

## 2020-03-21 DIAGNOSIS — B958 Unspecified staphylococcus as the cause of diseases classified elsewhere: Secondary | ICD-10-CM | POA: Diagnosis not present

## 2020-03-21 DIAGNOSIS — Z96651 Presence of right artificial knee joint: Secondary | ICD-10-CM | POA: Diagnosis not present

## 2020-03-22 DIAGNOSIS — Z471 Aftercare following joint replacement surgery: Secondary | ICD-10-CM | POA: Diagnosis not present

## 2020-03-22 DIAGNOSIS — M25561 Pain in right knee: Secondary | ICD-10-CM | POA: Diagnosis not present

## 2020-03-22 DIAGNOSIS — B958 Unspecified staphylococcus as the cause of diseases classified elsewhere: Secondary | ICD-10-CM | POA: Diagnosis not present

## 2020-03-22 DIAGNOSIS — R2689 Other abnormalities of gait and mobility: Secondary | ICD-10-CM | POA: Diagnosis not present

## 2020-03-22 DIAGNOSIS — Z96651 Presence of right artificial knee joint: Secondary | ICD-10-CM | POA: Diagnosis not present

## 2020-03-23 DIAGNOSIS — B958 Unspecified staphylococcus as the cause of diseases classified elsewhere: Secondary | ICD-10-CM | POA: Diagnosis not present

## 2020-03-23 DIAGNOSIS — Z96651 Presence of right artificial knee joint: Secondary | ICD-10-CM | POA: Diagnosis not present

## 2020-03-24 DIAGNOSIS — Z96651 Presence of right artificial knee joint: Secondary | ICD-10-CM | POA: Diagnosis not present

## 2020-03-24 DIAGNOSIS — B958 Unspecified staphylococcus as the cause of diseases classified elsewhere: Secondary | ICD-10-CM | POA: Diagnosis not present

## 2020-03-25 DIAGNOSIS — M25561 Pain in right knee: Secondary | ICD-10-CM | POA: Diagnosis not present

## 2020-03-25 DIAGNOSIS — B958 Unspecified staphylococcus as the cause of diseases classified elsewhere: Secondary | ICD-10-CM | POA: Diagnosis not present

## 2020-03-25 DIAGNOSIS — Z96651 Presence of right artificial knee joint: Secondary | ICD-10-CM | POA: Diagnosis not present

## 2020-03-25 DIAGNOSIS — Z471 Aftercare following joint replacement surgery: Secondary | ICD-10-CM | POA: Diagnosis not present

## 2020-03-25 DIAGNOSIS — R2689 Other abnormalities of gait and mobility: Secondary | ICD-10-CM | POA: Diagnosis not present

## 2020-03-26 DIAGNOSIS — B958 Unspecified staphylococcus as the cause of diseases classified elsewhere: Secondary | ICD-10-CM | POA: Diagnosis not present

## 2020-03-26 DIAGNOSIS — Z96651 Presence of right artificial knee joint: Secondary | ICD-10-CM | POA: Diagnosis not present

## 2020-03-27 DIAGNOSIS — M25561 Pain in right knee: Secondary | ICD-10-CM | POA: Diagnosis not present

## 2020-03-27 DIAGNOSIS — Z471 Aftercare following joint replacement surgery: Secondary | ICD-10-CM | POA: Diagnosis not present

## 2020-03-27 DIAGNOSIS — Z96651 Presence of right artificial knee joint: Secondary | ICD-10-CM | POA: Diagnosis not present

## 2020-03-27 DIAGNOSIS — R2689 Other abnormalities of gait and mobility: Secondary | ICD-10-CM | POA: Diagnosis not present

## 2020-03-27 DIAGNOSIS — B958 Unspecified staphylococcus as the cause of diseases classified elsewhere: Secondary | ICD-10-CM | POA: Diagnosis not present

## 2020-03-28 DIAGNOSIS — B958 Unspecified staphylococcus as the cause of diseases classified elsewhere: Secondary | ICD-10-CM | POA: Diagnosis not present

## 2020-03-28 DIAGNOSIS — Z96651 Presence of right artificial knee joint: Secondary | ICD-10-CM | POA: Diagnosis not present

## 2020-03-29 DIAGNOSIS — B958 Unspecified staphylococcus as the cause of diseases classified elsewhere: Secondary | ICD-10-CM | POA: Diagnosis not present

## 2020-03-29 DIAGNOSIS — Z96651 Presence of right artificial knee joint: Secondary | ICD-10-CM | POA: Diagnosis not present

## 2020-03-29 DIAGNOSIS — Z471 Aftercare following joint replacement surgery: Secondary | ICD-10-CM | POA: Diagnosis not present

## 2020-03-29 DIAGNOSIS — M25561 Pain in right knee: Secondary | ICD-10-CM | POA: Diagnosis not present

## 2020-03-29 DIAGNOSIS — R2689 Other abnormalities of gait and mobility: Secondary | ICD-10-CM | POA: Diagnosis not present

## 2020-03-30 DIAGNOSIS — B958 Unspecified staphylococcus as the cause of diseases classified elsewhere: Secondary | ICD-10-CM | POA: Diagnosis not present

## 2020-03-30 DIAGNOSIS — Z96651 Presence of right artificial knee joint: Secondary | ICD-10-CM | POA: Diagnosis not present

## 2020-03-31 DIAGNOSIS — B958 Unspecified staphylococcus as the cause of diseases classified elsewhere: Secondary | ICD-10-CM | POA: Diagnosis not present

## 2020-03-31 DIAGNOSIS — Z96651 Presence of right artificial knee joint: Secondary | ICD-10-CM | POA: Diagnosis not present

## 2020-04-01 DIAGNOSIS — Z471 Aftercare following joint replacement surgery: Secondary | ICD-10-CM | POA: Diagnosis not present

## 2020-04-01 DIAGNOSIS — R2689 Other abnormalities of gait and mobility: Secondary | ICD-10-CM | POA: Diagnosis not present

## 2020-04-01 DIAGNOSIS — B958 Unspecified staphylococcus as the cause of diseases classified elsewhere: Secondary | ICD-10-CM | POA: Diagnosis not present

## 2020-04-01 DIAGNOSIS — M25561 Pain in right knee: Secondary | ICD-10-CM | POA: Diagnosis not present

## 2020-04-01 DIAGNOSIS — Z96651 Presence of right artificial knee joint: Secondary | ICD-10-CM | POA: Diagnosis not present

## 2020-04-02 DIAGNOSIS — Z96651 Presence of right artificial knee joint: Secondary | ICD-10-CM | POA: Diagnosis not present

## 2020-04-02 DIAGNOSIS — B958 Unspecified staphylococcus as the cause of diseases classified elsewhere: Secondary | ICD-10-CM | POA: Diagnosis not present

## 2020-04-03 DIAGNOSIS — Z471 Aftercare following joint replacement surgery: Secondary | ICD-10-CM | POA: Diagnosis not present

## 2020-04-03 DIAGNOSIS — Z96651 Presence of right artificial knee joint: Secondary | ICD-10-CM | POA: Diagnosis not present

## 2020-04-03 DIAGNOSIS — R2689 Other abnormalities of gait and mobility: Secondary | ICD-10-CM | POA: Diagnosis not present

## 2020-04-03 DIAGNOSIS — M25561 Pain in right knee: Secondary | ICD-10-CM | POA: Diagnosis not present

## 2020-04-03 DIAGNOSIS — B958 Unspecified staphylococcus as the cause of diseases classified elsewhere: Secondary | ICD-10-CM | POA: Diagnosis not present

## 2020-04-04 DIAGNOSIS — Z96651 Presence of right artificial knee joint: Secondary | ICD-10-CM | POA: Diagnosis not present

## 2020-04-04 DIAGNOSIS — B958 Unspecified staphylococcus as the cause of diseases classified elsewhere: Secondary | ICD-10-CM | POA: Diagnosis not present

## 2020-04-05 DIAGNOSIS — R2689 Other abnormalities of gait and mobility: Secondary | ICD-10-CM | POA: Diagnosis not present

## 2020-04-05 DIAGNOSIS — B958 Unspecified staphylococcus as the cause of diseases classified elsewhere: Secondary | ICD-10-CM | POA: Diagnosis not present

## 2020-04-05 DIAGNOSIS — M25561 Pain in right knee: Secondary | ICD-10-CM | POA: Diagnosis not present

## 2020-04-05 DIAGNOSIS — Z471 Aftercare following joint replacement surgery: Secondary | ICD-10-CM | POA: Diagnosis not present

## 2020-04-05 DIAGNOSIS — Z96651 Presence of right artificial knee joint: Secondary | ICD-10-CM | POA: Diagnosis not present

## 2020-04-06 DIAGNOSIS — Z96651 Presence of right artificial knee joint: Secondary | ICD-10-CM | POA: Diagnosis not present

## 2020-04-06 DIAGNOSIS — B958 Unspecified staphylococcus as the cause of diseases classified elsewhere: Secondary | ICD-10-CM | POA: Diagnosis not present

## 2020-04-07 DIAGNOSIS — B958 Unspecified staphylococcus as the cause of diseases classified elsewhere: Secondary | ICD-10-CM | POA: Diagnosis not present

## 2020-04-07 DIAGNOSIS — Z96651 Presence of right artificial knee joint: Secondary | ICD-10-CM | POA: Diagnosis not present

## 2020-04-08 DIAGNOSIS — Z9049 Acquired absence of other specified parts of digestive tract: Secondary | ICD-10-CM | POA: Diagnosis not present

## 2020-04-08 DIAGNOSIS — I129 Hypertensive chronic kidney disease with stage 1 through stage 4 chronic kidney disease, or unspecified chronic kidney disease: Secondary | ICD-10-CM | POA: Diagnosis not present

## 2020-04-08 DIAGNOSIS — K769 Liver disease, unspecified: Secondary | ICD-10-CM | POA: Diagnosis not present

## 2020-04-08 DIAGNOSIS — Z96651 Presence of right artificial knee joint: Secondary | ICD-10-CM | POA: Diagnosis not present

## 2020-04-08 DIAGNOSIS — Z96659 Presence of unspecified artificial knee joint: Secondary | ICD-10-CM | POA: Diagnosis not present

## 2020-04-08 DIAGNOSIS — K7581 Nonalcoholic steatohepatitis (NASH): Secondary | ICD-10-CM | POA: Diagnosis not present

## 2020-04-08 DIAGNOSIS — E785 Hyperlipidemia, unspecified: Secondary | ICD-10-CM | POA: Diagnosis not present

## 2020-04-08 DIAGNOSIS — E538 Deficiency of other specified B group vitamins: Secondary | ICD-10-CM | POA: Diagnosis not present

## 2020-04-08 DIAGNOSIS — B958 Unspecified staphylococcus as the cause of diseases classified elsewhere: Secondary | ICD-10-CM | POA: Diagnosis not present

## 2020-04-08 DIAGNOSIS — Z792 Long term (current) use of antibiotics: Secondary | ICD-10-CM | POA: Diagnosis not present

## 2020-04-08 DIAGNOSIS — N189 Chronic kidney disease, unspecified: Secondary | ICD-10-CM | POA: Diagnosis not present

## 2020-04-08 DIAGNOSIS — D61818 Other pancytopenia: Secondary | ICD-10-CM | POA: Diagnosis not present

## 2020-04-08 DIAGNOSIS — E1122 Type 2 diabetes mellitus with diabetic chronic kidney disease: Secondary | ICD-10-CM | POA: Diagnosis not present

## 2020-04-08 DIAGNOSIS — M1990 Unspecified osteoarthritis, unspecified site: Secondary | ICD-10-CM | POA: Diagnosis not present

## 2020-04-08 DIAGNOSIS — T8450XA Infection and inflammatory reaction due to unspecified internal joint prosthesis, initial encounter: Secondary | ICD-10-CM | POA: Diagnosis not present

## 2020-04-08 DIAGNOSIS — Z452 Encounter for adjustment and management of vascular access device: Secondary | ICD-10-CM | POA: Diagnosis not present

## 2020-04-08 DIAGNOSIS — M109 Gout, unspecified: Secondary | ICD-10-CM | POA: Diagnosis not present

## 2020-04-08 DIAGNOSIS — D72810 Lymphocytopenia: Secondary | ICD-10-CM | POA: Diagnosis not present

## 2020-04-08 DIAGNOSIS — D696 Thrombocytopenia, unspecified: Secondary | ICD-10-CM | POA: Diagnosis not present

## 2020-04-08 DIAGNOSIS — E639 Nutritional deficiency, unspecified: Secondary | ICD-10-CM | POA: Diagnosis not present

## 2020-04-09 DIAGNOSIS — M25561 Pain in right knee: Secondary | ICD-10-CM | POA: Diagnosis not present

## 2020-04-09 DIAGNOSIS — Z471 Aftercare following joint replacement surgery: Secondary | ICD-10-CM | POA: Diagnosis not present

## 2020-04-09 DIAGNOSIS — D61818 Other pancytopenia: Secondary | ICD-10-CM | POA: Diagnosis not present

## 2020-04-09 DIAGNOSIS — R2689 Other abnormalities of gait and mobility: Secondary | ICD-10-CM | POA: Diagnosis not present

## 2020-04-09 DIAGNOSIS — B958 Unspecified staphylococcus as the cause of diseases classified elsewhere: Secondary | ICD-10-CM | POA: Diagnosis not present

## 2020-04-09 DIAGNOSIS — Z96651 Presence of right artificial knee joint: Secondary | ICD-10-CM | POA: Diagnosis not present

## 2020-04-09 DIAGNOSIS — D72810 Lymphocytopenia: Secondary | ICD-10-CM | POA: Diagnosis not present

## 2020-04-10 DIAGNOSIS — Z96651 Presence of right artificial knee joint: Secondary | ICD-10-CM | POA: Diagnosis not present

## 2020-04-10 DIAGNOSIS — M25561 Pain in right knee: Secondary | ICD-10-CM | POA: Diagnosis not present

## 2020-04-10 DIAGNOSIS — R2689 Other abnormalities of gait and mobility: Secondary | ICD-10-CM | POA: Diagnosis not present

## 2020-04-10 DIAGNOSIS — Z471 Aftercare following joint replacement surgery: Secondary | ICD-10-CM | POA: Diagnosis not present

## 2020-04-12 DIAGNOSIS — R2689 Other abnormalities of gait and mobility: Secondary | ICD-10-CM | POA: Diagnosis not present

## 2020-04-12 DIAGNOSIS — M25561 Pain in right knee: Secondary | ICD-10-CM | POA: Diagnosis not present

## 2020-04-12 DIAGNOSIS — Z471 Aftercare following joint replacement surgery: Secondary | ICD-10-CM | POA: Diagnosis not present

## 2020-04-12 DIAGNOSIS — Z96651 Presence of right artificial knee joint: Secondary | ICD-10-CM | POA: Diagnosis not present

## 2020-04-15 DIAGNOSIS — M25561 Pain in right knee: Secondary | ICD-10-CM | POA: Diagnosis not present

## 2020-04-15 DIAGNOSIS — R2689 Other abnormalities of gait and mobility: Secondary | ICD-10-CM | POA: Diagnosis not present

## 2020-04-15 DIAGNOSIS — Z96651 Presence of right artificial knee joint: Secondary | ICD-10-CM | POA: Diagnosis not present

## 2020-04-15 DIAGNOSIS — Z471 Aftercare following joint replacement surgery: Secondary | ICD-10-CM | POA: Diagnosis not present

## 2020-04-17 DIAGNOSIS — Z471 Aftercare following joint replacement surgery: Secondary | ICD-10-CM | POA: Diagnosis not present

## 2020-04-17 DIAGNOSIS — Z96651 Presence of right artificial knee joint: Secondary | ICD-10-CM | POA: Diagnosis not present

## 2020-04-17 DIAGNOSIS — B958 Unspecified staphylococcus as the cause of diseases classified elsewhere: Secondary | ICD-10-CM | POA: Diagnosis not present

## 2020-04-17 DIAGNOSIS — M25561 Pain in right knee: Secondary | ICD-10-CM | POA: Diagnosis not present

## 2020-04-17 DIAGNOSIS — R2689 Other abnormalities of gait and mobility: Secondary | ICD-10-CM | POA: Diagnosis not present

## 2020-04-19 DIAGNOSIS — Z471 Aftercare following joint replacement surgery: Secondary | ICD-10-CM | POA: Diagnosis not present

## 2020-04-19 DIAGNOSIS — M25561 Pain in right knee: Secondary | ICD-10-CM | POA: Diagnosis not present

## 2020-04-19 DIAGNOSIS — Z96651 Presence of right artificial knee joint: Secondary | ICD-10-CM | POA: Diagnosis not present

## 2020-04-19 DIAGNOSIS — R2689 Other abnormalities of gait and mobility: Secondary | ICD-10-CM | POA: Diagnosis not present

## 2020-04-22 DIAGNOSIS — N189 Chronic kidney disease, unspecified: Secondary | ICD-10-CM | POA: Diagnosis not present

## 2020-04-22 DIAGNOSIS — E785 Hyperlipidemia, unspecified: Secondary | ICD-10-CM | POA: Diagnosis not present

## 2020-04-22 DIAGNOSIS — M21061 Valgus deformity, not elsewhere classified, right knee: Secondary | ICD-10-CM | POA: Diagnosis not present

## 2020-04-22 DIAGNOSIS — Z9049 Acquired absence of other specified parts of digestive tract: Secondary | ICD-10-CM | POA: Diagnosis not present

## 2020-04-22 DIAGNOSIS — D696 Thrombocytopenia, unspecified: Secondary | ICD-10-CM | POA: Diagnosis not present

## 2020-04-22 DIAGNOSIS — D61818 Other pancytopenia: Secondary | ICD-10-CM | POA: Diagnosis not present

## 2020-04-22 DIAGNOSIS — Z96651 Presence of right artificial knee joint: Secondary | ICD-10-CM | POA: Diagnosis not present

## 2020-04-22 DIAGNOSIS — K829 Disease of gallbladder, unspecified: Secondary | ICD-10-CM | POA: Diagnosis not present

## 2020-04-22 DIAGNOSIS — N182 Chronic kidney disease, stage 2 (mild): Secondary | ICD-10-CM | POA: Insufficient documentation

## 2020-04-22 DIAGNOSIS — E1122 Type 2 diabetes mellitus with diabetic chronic kidney disease: Secondary | ICD-10-CM | POA: Diagnosis not present

## 2020-04-22 DIAGNOSIS — Z880 Allergy status to penicillin: Secondary | ICD-10-CM | POA: Diagnosis not present

## 2020-04-22 DIAGNOSIS — I129 Hypertensive chronic kidney disease with stage 1 through stage 4 chronic kidney disease, or unspecified chronic kidney disease: Secondary | ICD-10-CM | POA: Diagnosis not present

## 2020-04-22 DIAGNOSIS — Z471 Aftercare following joint replacement surgery: Secondary | ICD-10-CM | POA: Diagnosis not present

## 2020-04-22 DIAGNOSIS — T8450XA Infection and inflammatory reaction due to unspecified internal joint prosthesis, initial encounter: Secondary | ICD-10-CM | POA: Diagnosis not present

## 2020-04-22 DIAGNOSIS — D702 Other drug-induced agranulocytosis: Secondary | ICD-10-CM | POA: Diagnosis not present

## 2020-04-24 DIAGNOSIS — Z96651 Presence of right artificial knee joint: Secondary | ICD-10-CM | POA: Diagnosis not present

## 2020-04-24 DIAGNOSIS — R2689 Other abnormalities of gait and mobility: Secondary | ICD-10-CM | POA: Diagnosis not present

## 2020-04-24 DIAGNOSIS — M25561 Pain in right knee: Secondary | ICD-10-CM | POA: Diagnosis not present

## 2020-04-24 DIAGNOSIS — Z471 Aftercare following joint replacement surgery: Secondary | ICD-10-CM | POA: Diagnosis not present

## 2020-04-26 DIAGNOSIS — Z471 Aftercare following joint replacement surgery: Secondary | ICD-10-CM | POA: Diagnosis not present

## 2020-04-26 DIAGNOSIS — Z96651 Presence of right artificial knee joint: Secondary | ICD-10-CM | POA: Diagnosis not present

## 2020-04-26 DIAGNOSIS — M25561 Pain in right knee: Secondary | ICD-10-CM | POA: Diagnosis not present

## 2020-04-26 DIAGNOSIS — R2689 Other abnormalities of gait and mobility: Secondary | ICD-10-CM | POA: Diagnosis not present

## 2020-04-30 DIAGNOSIS — Z471 Aftercare following joint replacement surgery: Secondary | ICD-10-CM | POA: Diagnosis not present

## 2020-04-30 DIAGNOSIS — M25561 Pain in right knee: Secondary | ICD-10-CM | POA: Diagnosis not present

## 2020-04-30 DIAGNOSIS — R2689 Other abnormalities of gait and mobility: Secondary | ICD-10-CM | POA: Diagnosis not present

## 2020-04-30 DIAGNOSIS — Z96651 Presence of right artificial knee joint: Secondary | ICD-10-CM | POA: Diagnosis not present

## 2020-05-01 DIAGNOSIS — Z96651 Presence of right artificial knee joint: Secondary | ICD-10-CM | POA: Diagnosis not present

## 2020-05-01 DIAGNOSIS — M25561 Pain in right knee: Secondary | ICD-10-CM | POA: Diagnosis not present

## 2020-05-01 DIAGNOSIS — Z471 Aftercare following joint replacement surgery: Secondary | ICD-10-CM | POA: Diagnosis not present

## 2020-05-01 DIAGNOSIS — R2689 Other abnormalities of gait and mobility: Secondary | ICD-10-CM | POA: Diagnosis not present

## 2020-05-06 DIAGNOSIS — Z96651 Presence of right artificial knee joint: Secondary | ICD-10-CM | POA: Diagnosis not present

## 2020-05-06 DIAGNOSIS — R2689 Other abnormalities of gait and mobility: Secondary | ICD-10-CM | POA: Diagnosis not present

## 2020-05-06 DIAGNOSIS — M25561 Pain in right knee: Secondary | ICD-10-CM | POA: Diagnosis not present

## 2020-05-06 DIAGNOSIS — Z471 Aftercare following joint replacement surgery: Secondary | ICD-10-CM | POA: Diagnosis not present

## 2021-09-22 ENCOUNTER — Inpatient Hospital Stay (HOSPITAL_COMMUNITY): Payer: No Typology Code available for payment source

## 2021-09-22 ENCOUNTER — Other Ambulatory Visit: Payer: Self-pay

## 2021-09-22 ENCOUNTER — Encounter (HOSPITAL_COMMUNITY): Payer: Self-pay

## 2021-09-22 ENCOUNTER — Inpatient Hospital Stay (HOSPITAL_COMMUNITY)
Admission: EM | Admit: 2021-09-22 | Discharge: 2021-09-25 | DRG: 432 | Disposition: A | Discharge status: Home / Self Care | Payer: No Typology Code available for payment source | Attending: Family Medicine | Admitting: Family Medicine

## 2021-09-22 DIAGNOSIS — E119 Type 2 diabetes mellitus without complications: Secondary | ICD-10-CM

## 2021-09-22 DIAGNOSIS — E559 Vitamin D deficiency, unspecified: Secondary | ICD-10-CM | POA: Diagnosis present

## 2021-09-22 DIAGNOSIS — E785 Hyperlipidemia, unspecified: Secondary | ICD-10-CM | POA: Diagnosis present

## 2021-09-22 DIAGNOSIS — I8511 Secondary esophageal varices with bleeding: Secondary | ICD-10-CM | POA: Diagnosis present

## 2021-09-22 DIAGNOSIS — E1122 Type 2 diabetes mellitus with diabetic chronic kidney disease: Secondary | ICD-10-CM | POA: Diagnosis present

## 2021-09-22 DIAGNOSIS — Z7984 Long term (current) use of oral hypoglycemic drugs: Secondary | ICD-10-CM | POA: Diagnosis not present

## 2021-09-22 DIAGNOSIS — N1831 Chronic kidney disease, stage 3a: Secondary | ICD-10-CM | POA: Diagnosis present

## 2021-09-22 DIAGNOSIS — I10 Essential (primary) hypertension: Secondary | ICD-10-CM | POA: Diagnosis present

## 2021-09-22 DIAGNOSIS — K921 Melena: Secondary | ICD-10-CM | POA: Diagnosis present

## 2021-09-22 DIAGNOSIS — Z7982 Long term (current) use of aspirin: Secondary | ICD-10-CM

## 2021-09-22 DIAGNOSIS — M1990 Unspecified osteoarthritis, unspecified site: Secondary | ICD-10-CM | POA: Diagnosis present

## 2021-09-22 DIAGNOSIS — K3189 Other diseases of stomach and duodenum: Secondary | ICD-10-CM | POA: Diagnosis present

## 2021-09-22 DIAGNOSIS — E538 Deficiency of other specified B group vitamins: Secondary | ICD-10-CM | POA: Diagnosis present

## 2021-09-22 DIAGNOSIS — D696 Thrombocytopenia, unspecified: Secondary | ICD-10-CM

## 2021-09-22 DIAGNOSIS — R040 Epistaxis: Secondary | ICD-10-CM | POA: Diagnosis not present

## 2021-09-22 DIAGNOSIS — K922 Gastrointestinal hemorrhage, unspecified: Secondary | ICD-10-CM | POA: Diagnosis present

## 2021-09-22 DIAGNOSIS — N189 Chronic kidney disease, unspecified: Secondary | ICD-10-CM | POA: Diagnosis not present

## 2021-09-22 DIAGNOSIS — D693 Immune thrombocytopenic purpura: Secondary | ICD-10-CM | POA: Diagnosis present

## 2021-09-22 DIAGNOSIS — D61818 Other pancytopenia: Secondary | ICD-10-CM | POA: Insufficient documentation

## 2021-09-22 DIAGNOSIS — Z888 Allergy status to other drugs, medicaments and biological substances status: Secondary | ICD-10-CM | POA: Diagnosis not present

## 2021-09-22 DIAGNOSIS — I8501 Esophageal varices with bleeding: Secondary | ICD-10-CM | POA: Diagnosis not present

## 2021-09-22 DIAGNOSIS — I8500 Esophageal varices without bleeding: Secondary | ICD-10-CM | POA: Diagnosis not present

## 2021-09-22 DIAGNOSIS — Z8619 Personal history of other infectious and parasitic diseases: Secondary | ICD-10-CM | POA: Diagnosis present

## 2021-09-22 DIAGNOSIS — Z79899 Other long term (current) drug therapy: Secondary | ICD-10-CM | POA: Diagnosis not present

## 2021-09-22 DIAGNOSIS — K7460 Unspecified cirrhosis of liver: Principal | ICD-10-CM

## 2021-09-22 DIAGNOSIS — K7581 Nonalcoholic steatohepatitis (NASH): Secondary | ICD-10-CM | POA: Diagnosis not present

## 2021-09-22 DIAGNOSIS — M109 Gout, unspecified: Secondary | ICD-10-CM | POA: Diagnosis present

## 2021-09-22 DIAGNOSIS — I129 Hypertensive chronic kidney disease with stage 1 through stage 4 chronic kidney disease, or unspecified chronic kidney disease: Secondary | ICD-10-CM | POA: Diagnosis present

## 2021-09-22 DIAGNOSIS — K920 Hematemesis: Secondary | ICD-10-CM | POA: Diagnosis present

## 2021-09-22 DIAGNOSIS — E875 Hyperkalemia: Secondary | ICD-10-CM | POA: Diagnosis present

## 2021-09-22 DIAGNOSIS — Z880 Allergy status to penicillin: Secondary | ICD-10-CM | POA: Diagnosis not present

## 2021-09-22 DIAGNOSIS — R195 Other fecal abnormalities: Secondary | ICD-10-CM | POA: Diagnosis present

## 2021-09-22 DIAGNOSIS — K766 Portal hypertension: Secondary | ICD-10-CM | POA: Diagnosis present

## 2021-09-22 DIAGNOSIS — R58 Hemorrhage, not elsewhere classified: Secondary | ICD-10-CM | POA: Diagnosis not present

## 2021-09-22 HISTORY — DX: Hemorrhage, not elsewhere classified: R58

## 2021-09-22 HISTORY — DX: Unspecified osteoarthritis, unspecified site: M19.90

## 2021-09-22 HISTORY — DX: Nonalcoholic steatohepatitis (NASH): K75.81

## 2021-09-22 HISTORY — DX: Epistaxis: R04.0

## 2021-09-22 HISTORY — DX: Immune thrombocytopenic purpura: D69.3

## 2021-09-22 HISTORY — DX: Deficiency of other specified B group vitamins: E53.8

## 2021-09-22 HISTORY — DX: Vitamin D deficiency, unspecified: E55.9

## 2021-09-22 HISTORY — DX: Personal history of other infectious and parasitic diseases: Z86.19

## 2021-09-22 HISTORY — DX: Hyperlipidemia, unspecified: E78.5

## 2021-09-22 HISTORY — DX: Other pancytopenia: D61.818

## 2021-09-22 LAB — CBC WITH DIFFERENTIAL/PLATELET
Abs Immature Granulocytes: 0.07 10*3/uL (ref 0.00–0.07)
Basophils Absolute: 0.1 10*3/uL (ref 0.0–0.1)
Basophils Relative: 1 %
Eosinophils Absolute: 0.1 10*3/uL (ref 0.0–0.5)
Eosinophils Relative: 1 %
HCT: 32.7 % — ABNORMAL LOW (ref 39.0–52.0)
Hemoglobin: 10.9 g/dL — ABNORMAL LOW (ref 13.0–17.0)
Immature Granulocytes: 1 %
Lymphocytes Relative: 11 %
Lymphs Abs: 1.2 10*3/uL (ref 0.7–4.0)
MCH: 31.1 pg (ref 26.0–34.0)
MCHC: 33.3 g/dL (ref 30.0–36.0)
MCV: 93.2 fL (ref 80.0–100.0)
Monocytes Absolute: 0.5 10*3/uL (ref 0.1–1.0)
Monocytes Relative: 5 %
Neutro Abs: 8.3 10*3/uL — ABNORMAL HIGH (ref 1.7–7.7)
Neutrophils Relative %: 81 %
Platelets: 69 10*3/uL — ABNORMAL LOW (ref 150–400)
RBC: 3.51 MIL/uL — ABNORMAL LOW (ref 4.22–5.81)
RDW: 15.1 % (ref 11.5–15.5)
WBC: 10.2 10*3/uL (ref 4.0–10.5)
nRBC: 0 % (ref 0.0–0.2)

## 2021-09-22 LAB — COMPREHENSIVE METABOLIC PANEL
ALT: 22 U/L (ref 0–44)
AST: 23 U/L (ref 15–41)
Albumin: 3.8 g/dL (ref 3.5–5.0)
Alkaline Phosphatase: 57 U/L (ref 38–126)
Anion gap: 5 (ref 5–15)
BUN: 41 mg/dL — ABNORMAL HIGH (ref 8–23)
CO2: 21 mmol/L — ABNORMAL LOW (ref 22–32)
Calcium: 8.7 mg/dL — ABNORMAL LOW (ref 8.9–10.3)
Chloride: 110 mmol/L (ref 98–111)
Creatinine, Ser: 1.44 mg/dL — ABNORMAL HIGH (ref 0.61–1.24)
GFR, Estimated: 55 mL/min — ABNORMAL LOW (ref >60)
Glucose, Bld: 196 mg/dL — ABNORMAL HIGH (ref 70–99)
Potassium: 5.3 mmol/L — ABNORMAL HIGH (ref 3.5–5.1)
Sodium: 136 mmol/L (ref 135–145)
Total Bilirubin: 1.1 mg/dL (ref 0.3–1.2)
Total Protein: 6.7 g/dL (ref 6.5–8.1)

## 2021-09-22 LAB — PROTIME-INR
INR: 1.2 (ref 0.8–1.2)
Prothrombin Time: 15.2 seconds (ref 11.4–15.2)

## 2021-09-22 LAB — IRON AND TIBC
Iron: 132 ug/dL (ref 45–182)
Saturation Ratios: 37 % (ref 17.9–39.5)
TIBC: 357 ug/dL (ref 250–450)
UIBC: 225 ug/dL

## 2021-09-22 LAB — RETICULOCYTES
Immature Retic Fract: 28.1 % — ABNORMAL HIGH (ref 2.3–15.9)
RBC.: 3.54 MIL/uL — ABNORMAL LOW (ref 4.22–5.81)
Retic Count, Absolute: 78.2 10*3/uL (ref 19.0–186.0)
Retic Ct Pct: 2.2 % (ref 0.4–3.1)

## 2021-09-22 LAB — CBG MONITORING, ED: Glucose-Capillary: 163 mg/dL — ABNORMAL HIGH (ref 70–99)

## 2021-09-22 LAB — TYPE AND SCREEN
ABO/RH(D): O POS
Antibody Screen: NEGATIVE

## 2021-09-22 LAB — FERRITIN: Ferritin: 49 ng/mL (ref 24–336)

## 2021-09-22 LAB — VITAMIN B12: Vitamin B-12: 929 pg/mL — ABNORMAL HIGH (ref 180–914)

## 2021-09-22 LAB — VITAMIN D 25 HYDROXY (VIT D DEFICIENCY, FRACTURES): Vit D, 25-Hydroxy: 36.21 ng/mL (ref 30–100)

## 2021-09-22 LAB — GLUCOSE, CAPILLARY: Glucose-Capillary: 178 mg/dL — ABNORMAL HIGH (ref 70–99)

## 2021-09-22 LAB — FOLATE: Folate: 8.5 ng/mL (ref >5.9)

## 2021-09-22 LAB — POC OCCULT BLOOD, ED: Fecal Occult Bld: POSITIVE — AB

## 2021-09-22 MED ORDER — OCTREOTIDE LOAD VIA INFUSION
50.0000 ug | Freq: Once | INTRAVENOUS | Status: DC
  Filled 2021-09-22: qty 25

## 2021-09-22 MED ORDER — ONDANSETRON HCL 4 MG PO TABS: 4.0000 mg | ORAL_TABLET | Freq: Four times a day (QID) | ORAL | Status: DC | PRN

## 2021-09-22 MED ORDER — SODIUM CHLORIDE 0.9 % IV BOLUS
500.0000 mL | Freq: Once | INTRAVENOUS | Status: AC
  Administered 2021-09-22: 500 mL via INTRAVENOUS

## 2021-09-22 MED ORDER — FUROSEMIDE 10 MG/ML IJ SOLN
30.0000 mg | Freq: Once | INTRAMUSCULAR | Status: AC
  Administered 2021-09-22: 30 mg via INTRAVENOUS
  Filled 2021-09-22: qty 4

## 2021-09-22 MED ORDER — ACETAMINOPHEN 325 MG PO TABS: 650.0000 mg | ORAL_TABLET | Freq: Four times a day (QID) | ORAL | Status: DC | PRN

## 2021-09-22 MED ORDER — SODIUM CHLORIDE 0.9 % IV SOLN: INTRAVENOUS | Status: DC

## 2021-09-22 MED ORDER — PANTOPRAZOLE SODIUM 40 MG IV SOLR
40.0000 mg | Freq: Two times a day (BID) | INTRAVENOUS | Status: DC
  Administered 2021-09-22 – 2021-09-24 (×5): 40 mg via INTRAVENOUS
  Filled 2021-09-22 (×5): qty 10

## 2021-09-22 MED ORDER — OCTREOTIDE ACETATE 500 MCG/ML IJ SOLN
25.0000 ug/h | INTRAMUSCULAR | Status: DC
  Administered 2021-09-22 – 2021-09-24 (×5): 50 ug/h via INTRAVENOUS
  Filled 2021-09-22 (×8): qty 1

## 2021-09-22 MED ORDER — PANTOPRAZOLE SODIUM 40 MG IV SOLR
40.0000 mg | Freq: Once | INTRAVENOUS | Status: AC
  Administered 2021-09-22: 40 mg via INTRAVENOUS
  Filled 2021-09-22: qty 10

## 2021-09-22 MED ORDER — SODIUM CHLORIDE 0.9 % IV SOLN
1.0000 g | INTRAVENOUS | Status: DC
  Administered 2021-09-22 – 2021-09-24 (×3): 1 g via INTRAVENOUS
  Filled 2021-09-22 (×3): qty 10

## 2021-09-22 MED ORDER — TRAZODONE HCL 50 MG PO TABS
100.0000 mg | ORAL_TABLET | Freq: Every day | ORAL | Status: DC
  Administered 2021-09-22 – 2021-09-24 (×3): 100 mg via ORAL
  Filled 2021-09-22 (×3): qty 2

## 2021-09-22 MED ORDER — HYDRALAZINE HCL 20 MG/ML IJ SOLN: 5.0000 mg | INTRAMUSCULAR | Status: DC | PRN

## 2021-09-22 MED ORDER — FENTANYL CITRATE PF 50 MCG/ML IJ SOSY: 12.5000 ug | PREFILLED_SYRINGE | INTRAMUSCULAR | Status: DC | PRN

## 2021-09-22 MED ORDER — INSULIN ASPART 100 UNIT/ML IJ SOLN
0.0000 [IU] | Freq: Four times a day (QID) | INTRAMUSCULAR | Status: DC
  Administered 2021-09-22: 2 [IU] via SUBCUTANEOUS
  Administered 2021-09-23: 5 [IU] via SUBCUTANEOUS
  Administered 2021-09-23: 3 [IU] via SUBCUTANEOUS
  Administered 2021-09-23 – 2021-09-25 (×6): 2 [IU] via SUBCUTANEOUS
  Administered 2021-09-25: 5 [IU] via SUBCUTANEOUS
  Administered 2021-09-25: 2 [IU] via SUBCUTANEOUS

## 2021-09-22 MED ORDER — ACETAMINOPHEN 650 MG RE SUPP: 650.0000 mg | Freq: Four times a day (QID) | RECTAL | Status: DC | PRN

## 2021-09-22 MED ORDER — ALLOPURINOL 300 MG PO TABS
300.0000 mg | ORAL_TABLET | Freq: Every day | ORAL | Status: DC
Start: 2021-09-22 — End: 2021-09-25
  Administered 2021-09-22 – 2021-09-25 (×3): 300 mg via ORAL
  Filled 2021-09-22 (×3): qty 1

## 2021-09-22 MED ORDER — ONDANSETRON HCL 4 MG/2ML IJ SOLN: 4.0000 mg | Freq: Four times a day (QID) | INTRAMUSCULAR | Status: DC | PRN

## 2021-09-22 NOTE — Assessment & Plan Note (Signed)
--   reports h/o nephrolithiasis  ?-- following closely ? ?

## 2021-09-22 NOTE — Assessment & Plan Note (Addendum)
--   follow up B12 level: 929  ?

## 2021-09-22 NOTE — H&P (Signed)
?History and Physical  ?[place] ? ?George Oneill DJM:[id number] DOB: 1960-08-18 DOA: 09/22/2021 ? ?PCP: Manon Hilding, MD  ?Patient coming from: Home  ?Level of care: Telemetry ? ?I have personally briefly reviewed patient's old medical records in [place] ? ?Chief Complaint: throwing up blood  ? ?HPI: George Oneill is a 61 year old gentleman with history of NASH and associated with pancytopenia and chronic thrombocytopenia, anemia, splenomegaly, epistaxis, hypertension, type 2 diabetes mellitus, osteoarthritis, vitamin D deficiency, vitamin B12 deficiency, history of staph infection after knee surgery who presented to ED after he had started vomiting bright red blood this morning.  He had 2 separate episodes of hematemesis and he also had some melanotic stools.  He reports he had platelet transfusions in the past prior to surgery but no blood transfusions.  He says that he has no abdominal pain or chest pain.  He denies palpitations.  He denies SOB.  He reports he feels weaker than normal.  He is mostly active. He is a nonsmoker and non alcoholic drinker.   ? ?In the ED he was stool guaiac positive.  His vitals were fortunately stable.  His hemoglobin was 10.9 and platelets down to 69.  His potassium was 5.3.  His glucose was 196 and creatinine was 1.44.  He was typed and screened.  He was given IV fluid and an abdominal US and GI service was consulted and requested medical admission.   ? ?Review of Systems: Review of Systems  ?Constitutional:  Negative for chills and fever.  ?HENT: Negative.    ?Eyes: Negative.   ?Respiratory:  Negative for cough, hemoptysis, sputum production, shortness of breath and wheezing.   ?Cardiovascular:  Negative for chest pain, palpitations, orthopnea, claudication and leg swelling.  ?Gastrointestinal:  Positive for blood in stool, heartburn, melena and vomiting. Negative for abdominal pain.  ?Genitourinary: Negative.   ?Musculoskeletal: Negative.   ?Neurological:  Negative.   ?Endo/Heme/Allergies:  Bruises/bleeds easily.  ?Psychiatric/Behavioral: Negative.    ?All other systems reviewed and are negative. ?  ?Past Medical History:  ?Diagnosis Date  ? Arthritis   ? Chronic kidney disease   ? hx of kidney stones  ? Chronic thrombocytopenia   ? Diabetes mellitus   ? Epistaxis   ? Excessive bleeding   ? Gout   ? History of staph infection   ? Hyperlipidemia   ? Hypertension   ? NASH (nonalcoholic steatohepatitis)   ? Pancytopenia ([place])   ? Vitamin B12 deficiency   ? Vitamin D deficiency   ? ? ?Past Surgical History:  ?Procedure Laterality Date  ? CHOLECYSTECTOMY  01/16/2011  ? Procedure: LAPAROSCOPIC CHOLECYSTECTOMY;  Surgeon: Donato Heinz;  Location: [facility];  Service: General;  Laterality: N/A;  ? HERNIA REPAIR  [id number]  ? umbilical  ? KNEE ARTHROSCOPY  10 yrs ago  ? right knee-cone  ? PYLOROPLASTY    ? age 11 months  ? SKIN GRAFT    ? to head from [facility]  ? ? ? reports that he has never smoked. He does not have any smokeless tobacco history on file. He reports that he does not drink alcohol and does not use drugs. ? ?Allergies  ?Allergen Reactions  ? Ace Inhibitors   ?  Pt. Unaware  Of allergy to this drug   ? Penicillins Rash  ? ? ?Family History  ?Problem Relation Age of Onset  ? Anesthesia problems Neg Hx   ? Hypotension Neg Hx   ? Malignant hyperthermia Neg  Hx   ? Pseudochol deficiency Neg Hx   ? Colon cancer Neg Hx   ? Colon polyps Neg Hx   ? ? ?Prior to Admission medications   ?Medication Sig Start Date End Date Taking? Authorizing Provider  ?allopurinol (ZYLOPRIM) 300 MG tablet Take 300 mg by mouth daily.     Yes [provider]  ?lisinopril (PRINIVIL,ZESTRIL) 20 MG tablet Take 20 mg by mouth daily.     Yes [provider]  ?metFORMIN (GLUCOPHAGE) 1000 MG tablet Take 1,000 mg by mouth 2 (two) times daily. 09/04/21  Yes [provider]  ?traZODone (DESYREL) 100 MG tablet Take 100 mg by mouth at bedtime. 09/04/21  Yes [provider]   ? ? ?Physical Exam: ?Vitals:  ? 09/22/21 1040 09/22/21 1226 09/22/21 1230 09/22/21 1300  ?BP: 128/83 (!) 149/79 (!) 144/82 130/71  ?Pulse: (!) 107 98 85 89  ?Resp: '18 18 16 18  '$ ?Temp: 97.9 ?F (36.6 ?C)   98 ?F (36.7 ?C)  ?TempSrc: Oral   Oral  ?SpO2: 99% 100% 100% 99%  ?Weight:      ?Height:      ? ? ?Constitutional: NAD, calm, comfortable ?Eyes: PERRL, lids and conjunctivae normal ?ENMT: Mucous membranes are moist. Posterior pharynx clear of any exudate or lesions.Normal dentition.  ?Neck: normal, supple, no masses, no thyromegaly ?Respiratory: clear to auscultation bilaterally, no wheezing, no crackles. Normal respiratory effort. No accessory muscle use.  ?Cardiovascular: normal s1, s2 sounds, no murmurs / rubs / gallops. No extremity edema. 2+ pedal pulses. No carotid bruits.  ?Abdomen: no tenderness, no masses palpated. No hepatosplenomegaly. Bowel sounds positive.  ?Musculoskeletal: no clubbing / cyanosis. No joint deformity upper and lower extremities. Good ROM, no contractures. Normal muscle tone.  ?Skin: no rashes, lesions, ulcers. No induration ?Neurologic: CN 2-12 grossly intact. Sensation intact, DTR normal. Strength 5/5 in all 4.  ?Psychiatric: Normal judgment and insight. Alert and oriented x 3. Normal mood.  ? ?Labs on Admission: I have personally reviewed following labs and imaging studies ? ?CBC: ?Recent Labs  ?Lab 09/22/21 ?1158  ?WBC 10.2  ?NEUTROABS 8.3*  ?HGB 10.9*  ?HCT 32.7*  ?MCV 93.2  ?PLT 69*  ? ?Basic Metabolic Panel: ?Recent Labs  ?Lab 09/22/21 ?1158  ?NA 136  ?K 5.3*  ?CL 110  ?CO2 21*  ?GLUCOSE 196*  ?BUN 41*  ?CREATININE 1.44*  ?CALCIUM 8.7*  ? ?GFR: ?Estimated Creatinine Clearance: 69.2 mL/min (A) (by C-G formula based on SCr of 1.44 mg/dL (H)). ?Liver Function Tests: ?Recent Labs  ?Lab 09/22/21 ?1158  ?AST 23  ?ALT 22  ?ALKPHOS 57  ?BILITOT 1.1  ?PROT 6.7  ?ALBUMIN 3.8  ? ?No results for input(s): LIPASE, AMYLASE in the last 168 hours. ?No results for input(s): AMMONIA in the last  168 hours. ?Coagulation Profile: ?Recent Labs  ?Lab 09/22/21 ?1158  ?INR 1.2  ? ?Cardiac Enzymes: ?No results for input(s): CKTOTAL, CKMB, CKMBINDEX, TROPONINI in the last 168 hours. ?BNP (last 3 results) ?No results for input(s): PROBNP in the last 8760 hours. ?HbA1C: ?No results for input(s): HGBA1C in the last 72 hours. ?CBG: ?Recent Labs  ?Lab 09/22/21 ?1041  ?GLUCAP 163*  ? ?Lipid Profile: ?No results for input(s): CHOL, HDL, LDLCALC, TRIG, CHOLHDL, LDLDIRECT in the last 72 hours. ?Thyroid Function Tests: ?No results for input(s): TSH, T4TOTAL, FREET4, T3FREE, THYROIDAB in the last 72 hours. ?Anemia Panel: ?Recent Labs  ?  09/22/21 ?1158  ?RETICCTPCT 2.2  ? ?Urine analysis: ?No results found for: COLORURINE, APPEARANCEUR,  LABSPEC, PHURINE, GLUCOSEU, HGBUR, BILIRUBINUR, KETONESUR, PROTEINUR, UROBILINOGEN, NITRITE, LEUKOCYTESUR ? ?Radiological Exams on Admission: ?US Abdomen Complete ? ?Result Date: 09/22/2021 ?CLINICAL DATA:  Thrombocytopenia, remote cholecystectomy EXAM: ABDOMEN ULTRASOUND COMPLETE COMPARISON:  None Available. FINDINGS: Very limited scan due to patient habitus and bowel gas. Gallbladder: Surgically absent. Common bile duct: Diameter: 5 mm Liver: Diffusely echogenic and mildly heterogeneous liver parenchyma with suggestion of mild liver surface irregularity. No liver masses, noting decreased sensitivity in the setting of an echogenic liver. Portal vein is patent on color Doppler imaging with normal direction of blood flow towards the liver. IVC: No abnormality visualized. Pancreas: Not visualized due to overlying gas and habitus. Spleen: Moderate splenomegaly. Splenic volume 1045 cc (14.8 x 18.9 x 7.2 cm). No splenic masses. Right Kidney: Length: 9.7 cm. Echogenicity within normal limits. No mass or hydronephrosis visualized. Left Kidney: Length: 11.5 cm. Echogenicity within normal limits. No mass or hydronephrosis visualized. Abdominal aorta: No aneurysm visualized. Other findings: None.  IMPRESSION: 1. Limited scan. Echogenic and mildly heterogeneous liver parenchyma with suggestion of mild liver surface irregularity, cannot exclude liver cirrhosis. Suggest correlation with liver function tests. 2. Bile

## 2021-09-22 NOTE — Assessment & Plan Note (Addendum)
--   reports not currently on supplements ?-- follow up 25-OH vitamin D: 36: low normal range ?

## 2021-09-22 NOTE — ED Notes (Signed)
Pt ambulated to the bathroom.  

## 2021-09-22 NOTE — Assessment & Plan Note (Signed)
--   diet controlled, not on statin at this time  ?

## 2021-09-22 NOTE — Assessment & Plan Note (Addendum)
--   check CBG and SSI coverage ordered.  ?-- hold home oral diabetes medications ?-- A1c 7.2% which is evidence of uncontrolled disease ? ?CBG (last 3)  ?Recent Labs  ?  09/23/21 ?2909 09/23/21 ?1104 09/23/21 ?1258  ?GLUCAP 214* 171* 150*  ? ? ?

## 2021-09-22 NOTE — Assessment & Plan Note (Signed)
--   avoid NSAIDS ?-- acetaminophen PRN  ?

## 2021-09-22 NOTE — Assessment & Plan Note (Signed)
--   bleeding controlled at this time but following closely  ?

## 2021-09-22 NOTE — Assessment & Plan Note (Addendum)
--   GI service planning endoscopy later today ?-- pt had a nonbloody stool overnight ?

## 2021-09-22 NOTE — Assessment & Plan Note (Addendum)
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

## 2021-09-22 NOTE — Assessment & Plan Note (Signed)
--   upper endoscopy planned by GI service ?

## 2021-09-22 NOTE — ED Triage Notes (Signed)
Patient with complaints of vomiting blood that started this morning. States that he vomited 2 times and was bright red.   ?

## 2021-09-22 NOTE — Assessment & Plan Note (Signed)
--   not present at this time  ?

## 2021-09-22 NOTE — Plan of Care (Signed)
Pt brought to the 300 unit via w/c.  ?

## 2021-09-22 NOTE — Hospital Course (Addendum)
61 year old gentleman with history of NASH and associated with pancytopenia and chronic thrombocytopenia, anemia, splenomegaly, epistaxis, hypertension, type 2 diabetes mellitus, osteoarthritis, vitamin D deficiency, vitamin B12 deficiency, history of staph infection after knee surgery who presented to ED after he had started vomiting bright red blood this morning.  He had 2 separate episodes of hematemesis and he also had some melanotic stools.  He reports he had platelet transfusions in the past prior to surgery but no blood transfusions.  He says that he has no abdominal pain or chest pain.  He denies palpitations.  He denies SOB.  He reports he feels weaker than normal.  He is mostly active. He is a nonsmoker and non alcoholic drinker.   ? ?In the ED he was stool guaiac positive.  His vitals were fortunately stable.  His hemoglobin was 10.9 and platelets down to 69.  His potassium was 5.3.  His glucose was 196 and creatinine was 1.44.  He was typed and screened.  He was given IV fluid and an abdominal US and GI service was consulted and requested medical admission.   ? ?09/23/2021: Pt for EGD today.  Hg stable.  Follow up GI recommendations.  ?

## 2021-09-22 NOTE — ED Provider Notes (Signed)
?[facility] ?Provider Note ? ? ?CSN: [id number] ?Arrival date & time: 09/22/21  1016 ? ?  ? ?History ? ?Chief Complaint  ?Patient presents with  ? Hematemesis  ? ? ?George Oneill is a 61 y.o. male. ? ?Patient has a history of low platelets.  He states he has thrown up blood twice and had blood per rectum  two times. ? ?The history is provided by the patient and medical records. No language interpreter was used.  ?Emesis ?Severity:  Moderate ?Timing:  Intermittent ?Quality:  Bright red blood ?Able to tolerate:  Liquids ?Progression:  Worsening ?Chronicity:  New ?Recent urination:  Normal ?Relieved by:  Nothing ?Worsened by:  Nothing ?Ineffective treatments:  None tried ?Associated symptoms: abdominal pain   ?Associated symptoms: no cough, no diarrhea and no headaches   ?Risk factors: no alcohol use   ? ?  ? ?Home Medications ?Prior to Admission medications   ?Medication Sig Start Date End Date Taking? Authorizing Provider  ?allopurinol (ZYLOPRIM) 300 MG tablet Take 300 mg by mouth daily.      [provider]  ?aspirin EC 81 MG tablet Take 81 mg by mouth daily.      [provider]  ?fish oil-omega-3 fatty acids 1000 MG capsule Take 2 g by mouth 3 (three) times daily.      [provider]  ?lisinopril (PRINIVIL,ZESTRIL) 20 MG tablet Take 20 mg by mouth daily.      [provider]  ?metformin (FORTAMET) 1000 MG (OSM) 24 hr tablet Take 1,000 mg by mouth 3 (three) times daily after meals.      [provider]  ?Multiple Vitamins-Minerals (CENTRUM PO) Take 1 tablet by mouth daily.      [provider]  ?   ? ?Allergies    ?Ace inhibitors and Penicillins   ? ?Review of Systems   ?Review of Systems  ?Constitutional:  Negative for appetite change and fatigue.  ?HENT:  Negative for congestion, ear discharge and sinus pressure.   ?Eyes:  Negative for discharge.  ?Respiratory:  Negative for cough.   ?Cardiovascular:  Negative for chest pain.   ?Gastrointestinal:  Positive for abdominal pain and vomiting. Negative for diarrhea.  ?     Vomiting blood and blood per rectum  ?Genitourinary:  Negative for frequency and hematuria.  ?Musculoskeletal:  Negative for back pain.  ?Skin:  Negative for rash.  ?Neurological:  Negative for seizures and headaches.  ?Psychiatric/Behavioral:  Negative for hallucinations.   ? ?Physical Exam ?Updated Vital Signs ?BP 130/71   Pulse 89   Temp 97.9 ?F (36.6 ?C) (Oral)   Resp 18   Ht '6\' 1"'$  (1.854 m)   Wt 110.2 kg   SpO2 99%   BMI 32.06 kg/m?  ?Physical Exam ?Vitals and nursing note reviewed.  ?Constitutional:   ?   Appearance: He is well-developed.  ?HENT:  ?   Head: Normocephalic.  ?   Mouth/Throat:  ?   Mouth: Mucous membranes are moist.  ?Eyes:  ?   General: No scleral icterus. ?   Conjunctiva/sclera: Conjunctivae normal.  ?Neck:  ?   Thyroid: No thyromegaly.  ?Cardiovascular:  ?   Rate and Rhythm: Normal rate and regular rhythm.  ?   Heart sounds: No murmur heard. ?  No friction rub. No gallop.  ?Pulmonary:  ?   Breath sounds: No stridor. No wheezing or rales.  ?Chest:  ?   Chest wall: No tenderness.  ?Abdominal:  ?  General: There is no distension.  ?   Tenderness: There is abdominal tenderness. There is no rebound.  ?Genitourinary: ?   Comments: Heme positive stool ?Musculoskeletal:     ?   General: Normal range of motion.  ?   Cervical back: Neck supple.  ?Lymphadenopathy:  ?   Cervical: No cervical adenopathy.  ?Skin: ?   Findings: No erythema or rash.  ?Neurological:  ?   Mental Status: He is alert and oriented to person, place, and time.  ?   Motor: No abnormal muscle tone.  ?   Coordination: Coordination normal.  ?Psychiatric:     ?   Behavior: Behavior normal.  ? ? ?ED Results / Procedures / Treatments   ?Labs ?(all labs ordered are listed, but only abnormal results are displayed) ?Labs Reviewed  ?CBC WITH DIFFERENTIAL/PLATELET - Abnormal; Notable for the following components:  ?    Result Value  ? RBC 3.51  (*)   ? Hemoglobin 10.9 (*)   ? HCT 32.7 (*)   ? Platelets 69 (*)   ? Neutro Abs 8.3 (*)   ? All other components within normal limits  ?COMPREHENSIVE METABOLIC PANEL - Abnormal; Notable for the following components:  ? Potassium 5.3 (*)   ? CO2 21 (*)   ? Glucose, Bld 196 (*)   ? BUN 41 (*)   ? Creatinine, Ser 1.44 (*)   ? Calcium 8.7 (*)   ? GFR, Estimated 55 (*)   ? All other components within normal limits  ?CBG MONITORING, ED - Abnormal; Notable for the following components:  ? Glucose-Capillary 163 (*)   ? All other components within normal limits  ?POC OCCULT BLOOD, ED - Abnormal; Notable for the following components:  ? Fecal Occult Bld POSITIVE (*)   ? All other components within normal limits  ?PROTIME-INR  ?TYPE AND SCREEN  ? ? ?EKG ?None ? ?Radiology ?No results found. ? ?Procedures ?Procedures  ? ? ?Medications Ordered in ED ?Medications  ?octreotide (SANDOSTATIN) 2 mcg/mL load via infusion 50 mcg (has no administration in time range)  ?  And  ?octreotide (SANDOSTATIN) 500 mcg in sodium chloride 0.9 % 250 mL (2 mcg/mL) infusion (has no administration in time range)  ?pantoprazole (PROTONIX) injection 40 mg (has no administration in time range)  ?sodium chloride 0.9 % bolus 500 mL (0 mLs Intravenous Stopped 09/22/21 1305)  ?pantoprazole (PROTONIX) injection 40 mg (40 mg Intravenous Given 09/22/21 1305)  ? ? ?ED Course/ Medical Decision Making/ A&P ? CRITICAL CARE ?Performed by: Milton Ferguson ?Total critical care time: 40 minutes ?Critical care time was exclusive of separately billable procedures and treating other patients. ?Critical care was necessary to treat or prevent imminent or life-threatening deterioration. ?Critical care was time spent personally by me on the following activities: development of treatment plan with patient and/or surrogate as well as nursing, discussions with consultants, evaluation of patient's response to treatment, examination of patient, obtaining history from patient or  surrogate, ordering and performing treatments and interventions, ordering and review of laboratory studies, ordering and review of radiographic studies, pulse oximetry and re-evaluation of patient's condition. ? ?                        ?Medical Decision Making ?Amount and/or Complexity of Data Reviewed ?Labs: ordered. ? ?Risk ?Prescription drug management. ?Decision regarding hospitalization. ? ?This patient presents to the ED for concern of vomiting blood and blood per rectum, this involves an extensive number  of treatment options, and is a complaint that carries with it a high risk of complications and morbidity.  The differential diagnosis includes upper GI bleed, lower GI bleed ? ? ?Co morbidities that complicate the patient evaluation ? ?Thrombocytopenia ? ? ?Additional history obtained: ? ?Additional history obtained from significant other ?External records from outside source obtained and reviewed including hospital record ? ? ?Lab Tests: ? ?I Ordered, and personally interpreted labs.  The pertinent results include: CBC that showed hemoglobin 10.9, Hemoccult positive creatinine 1.44 and BUN 41 ? ? ?Imaging Studies ordered: ? ?I ordered imaging studies including ultrasound abdomen ?I independently visualized and interpreted imaging which showed negative ?I agree with the radiologist interpretation ? ? ?Cardiac Monitoring: / EKG: ? ?The patient was maintained on a cardiac monitor.  I personally viewed and interpreted the cardiac monitored which showed an underlying rhythm of: Normal sinus rhythm ? ? ?Consultations Obtained: ? ?I requested consultation with the GI and hospitalist,  and discussed lab and imaging findings as well as pertinent plan - they recommend: Hospitalist will admit and GI will consult ? ? ?Problem List / ED Course / Critical interventions / Medication management ? ?Low platelets and GI bleed ?I ordered medication including Protonix ?Reevaluation of the patient after these medicines showed  that the patient improved ?I have reviewed the patients home medicines and have made adjustments as needed ? ? ?Social Determinants of Health: ? ?None ? ? ?Test / Admission - Considered: ? ?CT abdomen ? ? ? ?Patient with a GI

## 2021-09-22 NOTE — ED Notes (Signed)
Octreotide not started due to wait from pharmacy and ultrasound in the room. ? ?

## 2021-09-22 NOTE — Assessment & Plan Note (Signed)
--   IV meds for now, hold home ACEI due to hyperkalemia and NPO status ?

## 2021-09-22 NOTE — Assessment & Plan Note (Signed)
--   he was treated for this years ago with PICC line and longterm antibiotics ?
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
## 2021-09-22 NOTE — Assessment & Plan Note (Signed)
--   He saw a hematologist in 2022 and the thought was that NASH was causing this ?-- follow closely, transfuse if needed ?

## 2021-09-22 NOTE — Assessment & Plan Note (Addendum)
--   admit to telemetry ?-- monitor H/H ?-- continue IV protonix BID per GI recommendation ?-- octreotide ordered per GI service   ?-- endoscopy per GI recommendation  ?-- type and screen  ?-- supportive measures as ordered  ?-- NPO pending GI eval and endoscopy results : further recs to follow.  ?

## 2021-09-22 NOTE — Assessment & Plan Note (Signed)
--   resume home allopurinol when able to take p.o.  ?

## 2021-09-22 NOTE — Assessment & Plan Note (Addendum)
--   secondary to ACEI, gave 1 time dose of IV lasix, holding ACEI ?-- follow BMP:  K down to 4.9 today  ?

## 2021-09-23 ENCOUNTER — Encounter (HOSPITAL_COMMUNITY)
Admission: EM | Disposition: A | Discharge status: Home / Self Care | Payer: Self-pay | Source: Home / Self Care | Attending: Family Medicine

## 2021-09-23 ENCOUNTER — Inpatient Hospital Stay (HOSPITAL_COMMUNITY): Payer: No Typology Code available for payment source | Admitting: Anesthesiology

## 2021-09-23 ENCOUNTER — Encounter (HOSPITAL_COMMUNITY): Payer: Self-pay | Admitting: Family Medicine

## 2021-09-23 DIAGNOSIS — K3189 Other diseases of stomach and duodenum: Secondary | ICD-10-CM

## 2021-09-23 DIAGNOSIS — K920 Hematemesis: Secondary | ICD-10-CM | POA: Diagnosis not present

## 2021-09-23 DIAGNOSIS — D693 Immune thrombocytopenic purpura: Secondary | ICD-10-CM | POA: Diagnosis not present

## 2021-09-23 DIAGNOSIS — N189 Chronic kidney disease, unspecified: Secondary | ICD-10-CM | POA: Diagnosis not present

## 2021-09-23 DIAGNOSIS — K766 Portal hypertension: Secondary | ICD-10-CM

## 2021-09-23 DIAGNOSIS — I8501 Esophageal varices with bleeding: Secondary | ICD-10-CM

## 2021-09-23 DIAGNOSIS — I8500 Esophageal varices without bleeding: Secondary | ICD-10-CM | POA: Diagnosis not present

## 2021-09-23 DIAGNOSIS — R195 Other fecal abnormalities: Secondary | ICD-10-CM | POA: Diagnosis not present

## 2021-09-23 DIAGNOSIS — K922 Gastrointestinal hemorrhage, unspecified: Secondary | ICD-10-CM | POA: Diagnosis not present

## 2021-09-23 DIAGNOSIS — I10 Essential (primary) hypertension: Secondary | ICD-10-CM

## 2021-09-23 HISTORY — PX: ESOPHAGOGASTRODUODENOSCOPY (EGD) WITH PROPOFOL: SHX5813

## 2021-09-23 HISTORY — PX: ESOPHAGEAL BANDING: SHX5518

## 2021-09-23 LAB — COMPREHENSIVE METABOLIC PANEL
ALT: 19 U/L (ref 0–44)
AST: 20 U/L (ref 15–41)
Albumin: 3.6 g/dL (ref 3.5–5.0)
Alkaline Phosphatase: 49 U/L (ref 38–126)
Anion gap: 7 (ref 5–15)
BUN: 51 mg/dL — ABNORMAL HIGH (ref 8–23)
CO2: 22 mmol/L (ref 22–32)
Calcium: 8.8 mg/dL — ABNORMAL LOW (ref 8.9–10.3)
Chloride: 111 mmol/L (ref 98–111)
Creatinine, Ser: 1.83 mg/dL — ABNORMAL HIGH (ref 0.61–1.24)
GFR, Estimated: 41 mL/min — ABNORMAL LOW (ref >60)
Glucose, Bld: 195 mg/dL — ABNORMAL HIGH (ref 70–99)
Potassium: 4.9 mmol/L (ref 3.5–5.1)
Sodium: 140 mmol/L (ref 135–145)
Total Bilirubin: 0.9 mg/dL (ref 0.3–1.2)
Total Protein: 6.4 g/dL — ABNORMAL LOW (ref 6.5–8.1)

## 2021-09-23 LAB — GLUCOSE, CAPILLARY
Glucose-Capillary: 150 mg/dL — ABNORMAL HIGH (ref 70–99)
Glucose-Capillary: 160 mg/dL — ABNORMAL HIGH (ref 70–99)
Glucose-Capillary: 161 mg/dL — ABNORMAL HIGH (ref 70–99)
Glucose-Capillary: 171 mg/dL — ABNORMAL HIGH (ref 70–99)
Glucose-Capillary: 214 mg/dL — ABNORMAL HIGH (ref 70–99)
Glucose-Capillary: 279 mg/dL — ABNORMAL HIGH (ref 70–99)

## 2021-09-23 LAB — CBC
HCT: 31.5 % — ABNORMAL LOW (ref 39.0–52.0)
Hemoglobin: 10.3 g/dL — ABNORMAL LOW (ref 13.0–17.0)
MCH: 30.5 pg (ref 26.0–34.0)
MCHC: 32.7 g/dL (ref 30.0–36.0)
MCV: 93.2 fL (ref 80.0–100.0)
Platelets: 67 10*3/uL — ABNORMAL LOW (ref 150–400)
RBC: 3.38 MIL/uL — ABNORMAL LOW (ref 4.22–5.81)
RDW: 15.3 % (ref 11.5–15.5)
WBC: 6.4 10*3/uL (ref 4.0–10.5)
nRBC: 0 % (ref 0.0–0.2)

## 2021-09-23 LAB — HEMOGLOBIN A1C
Hgb A1c MFr Bld: 7.2 % — ABNORMAL HIGH (ref 4.8–5.6)
Mean Plasma Glucose: 159.94 mg/dL

## 2021-09-23 LAB — HIV ANTIBODY (ROUTINE TESTING W REFLEX): HIV Screen 4th Generation wRfx: NONREACTIVE

## 2021-09-23 LAB — MAGNESIUM: Magnesium: 1.7 mg/dL (ref 1.7–2.4)

## 2021-09-23 SURGERY — ESOPHAGOGASTRODUODENOSCOPY (EGD) WITH PROPOFOL
Anesthesia: General

## 2021-09-23 MED ORDER — PHENYLEPHRINE 80 MCG/ML (10ML) SYRINGE FOR IV PUSH (FOR BLOOD PRESSURE SUPPORT)
PREFILLED_SYRINGE | INTRAVENOUS | Status: DC | PRN
  Administered 2021-09-23 (×2): 160 ug via INTRAVENOUS

## 2021-09-23 MED ORDER — LIDOCAINE HCL (CARDIAC) PF 100 MG/5ML IV SOSY
PREFILLED_SYRINGE | INTRAVENOUS | Status: DC | PRN
  Administered 2021-09-23: 100 mg via INTRATRACHEAL

## 2021-09-23 MED ORDER — PROPOFOL 10 MG/ML IV BOLUS
INTRAVENOUS | Status: DC | PRN
  Administered 2021-09-23: 50 mg via INTRAVENOUS
  Administered 2021-09-23: 80 mg via INTRAVENOUS
  Administered 2021-09-23 (×2): 50 mg via INTRAVENOUS

## 2021-09-23 MED ORDER — OCTREOTIDE ACETATE 500 MCG/ML IJ SOLN
INTRAMUSCULAR | Status: AC
  Filled 2021-09-23: qty 1

## 2021-09-23 MED ORDER — PHENYLEPHRINE 80 MCG/ML (10ML) SYRINGE FOR IV PUSH (FOR BLOOD PRESSURE SUPPORT): PREFILLED_SYRINGE | INTRAVENOUS | Status: DC | PRN

## 2021-09-23 MED ORDER — LACTATED RINGERS IV SOLN: INTRAVENOUS | Status: DC

## 2021-09-23 NOTE — Progress Notes (Signed)
?  Transition of Care (TOC) Screening Note ? ? ?Patient Details  ?Name: George Oneill ?Date of Birth: 1961/02/26 ? ? ?Transition of Care (TOC) CM/SW Contact:    ?Arjuna Doeden D, LCSW ?Phone Number: ?09/23/2021, 2:03 PM ? ? ? ?Transition of Care Department [facility]) has reviewed patient and no TOC needs have been identified at this time. We will continue to monitor patient advancement through interdisciplinary progression rounds. If new patient transition needs arise, please place a TOC consult. ? ? ?

## 2021-09-23 NOTE — Transfer of Care (Signed)
Immediate Anesthesia Transfer of Care Note ? ?Patient: George Oneill ? ?Procedure(s) Performed: ESOPHAGOGASTRODUODENOSCOPY (EGD) WITH PROPOFOL ?ESOPHAGEAL BANDING ? ?Patient Location: PACU ? ?Anesthesia Type:MAC ? ?Level of Consciousness: awake, alert , oriented and patient cooperative ? ?Airway & Oxygen Therapy: Patient Spontanous Breathing and Patient connected to nasal cannula oxygen ? ?Post-op Assessment: Report given to RN, Post -op Vital signs reviewed and stable and Patient moving all extremities ? ?Post vital signs: Reviewed and stable ? ?Last Vitals:  ?Vitals Value Taken Time  ?BP 87/53 09/23/21 1427  ?Temp    ?Pulse 88 09/23/21 1430  ?Resp 18 09/23/21 1430  ?SpO2 100 % 09/23/21 1430  ?Vitals shown include unvalidated device data. ? ?Last Pain:  ?Vitals:  ? 09/23/21 1409  ?TempSrc:   ?PainSc: 0-No pain  ?   ? ?  ? ?Complications: No notable events documented. ?

## 2021-09-23 NOTE — Op Note (Signed)
[facility] ?Patient Name: George Oneill ?Procedure Date: 09/23/2021 1:49 PM ?MRN: [id number] ?Date of Birth: [birth year] ?Attending MD: Hildred Laser , MD ?CSN: [id number] ?Age: 61 ?Admit Type: Inpatient ?Procedure:                Upper GI endoscopy ?Indications:              Hematemesis ?Providers:                Hildred Laser, MD, Pavo Page, [place] Cyndi Bender  ?                          Tech, Technician ?Referring MD:             Irwin Brakeman, MD ?Medicines:                Propofol per Anesthesia ?Complications:            No immediate complications. ?Estimated Blood Loss:     Estimated blood loss: none. ?Procedure:                Pre-Anesthesia Assessment: ?                          - Prior to the procedure, a History and Physical  ?                          was performed, and patient medications and  ?                          allergies were reviewed. The patient's tolerance of  ?                          previous anesthesia was also reviewed. The risks  ?                          and benefits of the procedure and the sedation  ?                          options and risks were discussed with the patient.  ?                          All questions were answered, and informed consent  ?                          was obtained. Prior Anticoagulants: The patient has  ?                          taken no previous anticoagulant or antiplatelet  ?                          agents except for aspirin. ASA Grade Assessment:  ?                          III - A patient with severe systemic disease. After  ?  reviewing the risks and benefits, the patient was  ?                          deemed in satisfactory condition to undergo the  ?                          procedure. ?                          After obtaining informed consent, the endoscope was  ?                          passed under direct vision. Throughout the  ?                          procedure, the patient's blood pressure, pulse,  and  ?                          oxygen saturations were monitored continuously. The  ?                          GIF-H190 ([id number]) scope was introduced through the  ?                          mouth, and advanced to the second part of duodenum.  ?                          The upper GI endoscopy was accomplished without  ?                          difficulty. The patient tolerated the procedure  ?                          well. ?Scope In: 2:14:21 PM ?Scope Out: 2:23:19 PM ?Total Procedure Duration: 0 hours 8 minutes 58 seconds  ?Findings: ?     The hypopharynx was normal. ?     The proximal esophagus and mid esophagus were normal. ?     Grade I, grade III varices were found in the distal esophagus. Two bands  ?     were successfully placed with complete eradication, resulting in  ?     deflation of varices. There was no bleeding during and at the end of the  ?     procedure. ?     The Z-line was regular and was found 40 cm from the incisors. ?     Mild portal hypertensive gastropathy was found in the entire examined  ?     stomach. ?     The exam of the stomach was otherwise normal. ?     The duodenal bulb and second portion of the duodenum were normal. ?Impression:               - Normal hypopharynx. ?                          - Normal proximal esophagus and mid esophagus. ?                          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 2  ?                          columns are grade 1. Third: Grade III with red  ?                          markings. It was banded at 2 levels and completely  ?                          eradicated. ?                          - Z-line regular, 40 cm from the incisors. ?                          - Portal hypertensive gastropathy. ?                          - Normal duodenal bulb and second portion of the  ?                          duodenum. ?                          - No specimens collected. ?Moderate Sedation: ?     Per Anesthesia Care ?Recommendation:           - Return patient  to hospital ward for ongoing care. ?                          - Clear liquid diet today. ?                          - Continue present medications. ?                          - Repeat upper endoscopy in 4 weeks. ?Procedure Code(s):        --- Professional --- ?                          [phone], Esophagogastroduodenoscopy, flexible,  ?                          transoral; with band ligation of esophageal/gastric  ?                          varices ?Diagnosis Code(s):        --- Professional --- ?                          I85.00, Esophageal varices without bleeding ?                          K76.6, Portal hypertension ?                          K31.89, Other diseases of stomach and duodenum ?  K92.0, Hematemesis ?CPT copyright 2019 [employer]. All rights reserved. ?The codes documented in this report are preliminary and upon coder review may  ?be revised to meet current compliance requirements. ?Hildred Laser, MD ?Hildred Laser, MD ?09/23/2021 2:38:01 PM ?This report has been signed electronically. ?Number of Addenda: 0 ?

## 2021-09-23 NOTE — Progress Notes (Signed)
?Subjective: ? ?Patient has no complaints.  He states he vomited small amount of blood before he went to dentist office.  While he was getting dental work he vomited half a gallon of liquid and blood.  He has had tarry stool since then.  He feels fine this morning.  He states he has never been diagnosed with cirrhosis.  He has seen Dr. Candie Echevaria of [facility] oncology. ? ?Current Medications: ? ?Current Facility-Administered Medications:  ?  0.9 %  sodium chloride infusion, , Intravenous, Continuous, Johnson, Clanford L, MD ?  Doug Sou Hold] acetaminophen (TYLENOL) tablet 650 mg, 650 mg, Oral, Q6H PRN **OR** [MAR Hold] acetaminophen (TYLENOL) suppository 650 mg, 650 mg, Rectal, Q6H PRN, Johnson, Clanford L, MD ?  [MAR Hold] allopurinol (ZYLOPRIM) tablet 300 mg, 300 mg, Oral, Daily, Johnson, Clanford L, MD, 300 mg at 09/22/21 1542 ?  [MAR Hold] cefTRIAXone (ROCEPHIN) 1 g in sodium chloride 0.9 % 100 mL IVPB, 1 g, Intravenous, Q24H, Annitta Needs, NP, Last Rate: 200 mL/hr at 09/22/21 1657, 1 g at 09/22/21 1657 ?  [MAR Hold] fentaNYL (SUBLIMAZE) injection 12.5 mcg, 12.5 mcg, Intravenous, Q2H PRN, Johnson, Clanford L, MD ?  Doug Sou Hold] hydrALAZINE (APRESOLINE) injection 5 mg, 5 mg, Intravenous, Q4H PRN, Johnson, Clanford L, MD ?  Doug Sou Hold] insulin aspart (novoLOG) injection 0-9 Units, 0-9 Units, Subcutaneous, Q6H, Johnson, Clanford L, MD, 3 Units at 09/23/21 0554 ?  lactated ringers infusion, , Intravenous, Continuous, Battula, Rajamani C, MD, Last Rate: 50 mL/hr at 09/23/21 1345, Continued from Pre-op at 09/23/21 1345 ?  [MAR Hold] octreotide (SANDOSTATIN) 2 mcg/mL load via infusion 50 mcg, 50 mcg, Intravenous, Once **AND** [MAR Hold] octreotide (SANDOSTATIN) 500 mcg in sodium chloride 0.9 % 250 mL (2 mcg/mL) infusion, 50 mcg/hr, Intravenous, Continuous, Johnson, Clanford L, MD, Last Rate: 25 mL/hr at 09/23/21 1157, 50 mcg/hr at 09/23/21 1157 ?  [MAR Hold] ondansetron (ZOFRAN) tablet 4 mg, 4 mg, Oral, Q6H PRN **OR** [MAR Hold]  ondansetron (ZOFRAN) injection 4 mg, 4 mg, Intravenous, Q6H PRN, Johnson, Clanford L, MD ?  [MAR Hold] pantoprazole (PROTONIX) injection 40 mg, 40 mg, Intravenous, Q12H, Johnson, Clanford L, MD, 40 mg at 09/23/21 1157 ?  [MAR Hold] traZODone (DESYREL) tablet 100 mg, 100 mg, Oral, QHS, Johnson, Clanford L, MD, 100 mg at 09/22/21 2207 ? ? ?Objective: ?Blood pressure 101/67, pulse 83, temperature 98.3 ?F (36.8 ?C), temperature source Oral, resp. rate 19, height '6\' 1"'  (1.854 m), weight 110.2 kg, SpO2 95 %. ?Patient is alert. ?He does not have asterixis. ?No neck masses or thyromegaly noted. ?Cardiac exam with regular rhythm normal S1 and S2.  No murmur or gallop noted. ?Auscultation lungs reveal vesicular breath sounds bilaterally.  No rales or rhonchi. ?Abdomen is full but soft and nontender with organomegaly or masses. ?No peripheral edema or clubbing noted. ? ?Labs/studies Results: ? ? ? ?  Latest Ref Rng & Units 09/23/2021  ?  5:23 AM 09/22/2021  ? 11:58 AM 01/14/2011  ?  2:55 PM  ?CBC  ?WBC 4.0 - 10.5 K/uL 6.4   10.2   6.7    ?Hemoglobin 13.0 - 17.0 g/dL 10.3   10.9   15.5    ?Hematocrit 39.0 - 52.0 % 31.5   32.7   44.6    ?Platelets 150 - 400 K/uL 67   69   111    ?  ? ?  Latest Ref Rng & Units 09/23/2021  ?  5:23 AM 09/22/2021  ? 11:58 AM 01/14/2011  ?  2:55 PM  ?CMP  ?Glucose 70 - 99 mg/dL 195   196   208    ?BUN 8 - 23 mg/dL 51   41   14    ?Creatinine 0.61 - 1.24 mg/dL 1.83   1.44   0.86    ?Sodium 135 - 145 mmol/L 140   136   139    ?Potassium 3.5 - 5.1 mmol/L 4.9   5.3   4.3    ?Chloride 98 - 111 mmol/L 111   110   100    ?CO2 22 - 32 mmol/L '22   21   28    ' ?Calcium 8.9 - 10.3 mg/dL 8.8   8.7   9.9    ?Total Protein 6.5 - 8.1 g/dL 6.4   6.7     ?Total Bilirubin 0.3 - 1.2 mg/dL 0.9   1.1     ?Alkaline Phos 38 - 126 U/L 49   57     ?AST 15 - 41 U/L 20   23     ?ALT 0 - 44 U/L 19   22     ?  ? ?  Latest Ref Rng & Units 09/23/2021  ?  5:23 AM 09/22/2021  ? 11:58 AM 06/21/2009  ? 12:15 PM  ?Hepatic Function  ?Total Protein  6.5 - 8.1 g/dL 6.4   6.7   8.4    ?Albumin 3.5 - 5.0 g/dL 3.6   3.8   4.4    ?AST 15 - 41 U/L 20   23   48    ?ALT 0 - 44 U/L 19   22   59    ?Alk Phosphatase 38 - 126 U/L 49   57   82    ?Total Bilirubin 0.3 - 1.2 mg/dL 0.9   1.1   1.2    ?  ?INR 1.2 yesterday ? ?Assessment: ? ?Upper GI bleed in patient with pancytopenia and history of fatty liver very concerning for esophageal variceal bleed.  Patient is hemodynamically stable. ?Thrombocytopenia stable. ? ? ?Plan: ? ?Proceed with esophagogastroduodenoscopy with therapeutic intervention which possibly would be esophageal variceal banding unless he has gastric varices. ? ? ? ? ? ?

## 2021-09-23 NOTE — Anesthesia Postprocedure Evaluation (Signed)
Anesthesia Post Note ? ?Patient: KOA ZOELLER ? ?Procedure(s) Performed: ESOPHAGOGASTRODUODENOSCOPY (EGD) WITH PROPOFOL ?ESOPHAGEAL BANDING ? ?Patient location during evaluation: Phase II ?Anesthesia Type: General ?Level of consciousness: awake and alert and oriented ?Pain management: pain level controlled ?Vital Signs Assessment: post-procedure vital signs reviewed and stable ?Respiratory status: spontaneous breathing, nonlabored ventilation and respiratory function stable ?Cardiovascular status: blood pressure returned to baseline and stable ?Postop Assessment: no apparent nausea or vomiting ?Anesthetic complications: no ? ? ?No notable events documented. ? ? ?Last Vitals:  ?Vitals:  ? 09/23/21 1430 09/23/21 1435  ?BP: (!) 87/44 108/69  ?Pulse: 80 87  ?Resp: (!) 22 11  ?Temp:  36.5 ?C  ?SpO2: 98% 100%  ?  ?Last Pain:  ?Vitals:  ? 09/23/21 1427  ?TempSrc:   ?PainSc: 0-No pain  ? ? ?  ?  ?  ?  ?  ?  ? ?Jalie Eiland C Terrilyn Tyner ? ? ? ? ?

## 2021-09-23 NOTE — Progress Notes (Signed)
Brief EGD note. ? ?3 columns of esophageal varices.  2 columns of very small.  Third column grade 3 with red markings on it. ?This column banded at 2 levels. ?Mild portal hypertensive gastropathy. ?No evidence of gastric varices or peptic ulcer disease. ?Normal bulbar and postbulbar mucosa. ?

## 2021-09-23 NOTE — Progress Notes (Signed)
?   09/23/21 0349  ?Assess: MEWS Score  ?Temp 98.3 ?F (36.8 ?C)  ?BP (!) 80/39  ?Pulse Rate 71  ?Resp 17  ?SpO2 97 %  ?O2 Device Room Air  ?Assess: MEWS Score  ?MEWS Temp 0  ?MEWS Systolic 2  ?MEWS Pulse 0  ?MEWS RR 0  ?MEWS LOC 0  ?MEWS Score 2  ?MEWS Score Color Yellow  ?Assess: if the MEWS score is Yellow or Red  ?Were vital signs taken at a resting state? Yes  ?Focused Assessment No change from prior assessment  ?Early Detection of Sepsis Score *See Row Information* Low  ?MEWS guidelines implemented *See Row Information* Yes  ?Treat  ?Pain Scale 0-10  ?Pain Score 0  ?Escalate  ?MEWS: Escalate Yellow: discuss with charge nurse/RN and consider discussing with provider and RRT  ?Notify: Charge Nurse/RN  ?Name of Charge Nurse/RN Notified Ileana Roup, RN  ?Date Charge Nurse/RN Notified 09/23/21  ?Time Charge Nurse/RN Notified 0400  ? ? ?

## 2021-09-23 NOTE — Progress Notes (Signed)
?PROGRESS NOTE ? ? ?George Oneill  ATF:[id number] DOB: 01-Nov-1960 DOA: 09/22/2021 ?PCP: Manon Hilding, MD  ? ?Chief Complaint  ?Patient presents with  ? Hematemesis  ? ?Level of care: Telemetry ? ?Brief Admission History:  ?61 year old gentleman with history of NASH and associated with pancytopenia and chronic thrombocytopenia, anemia, splenomegaly, epistaxis, hypertension, type 2 diabetes mellitus, osteoarthritis, vitamin D deficiency, vitamin B12 deficiency, history of staph infection after knee surgery who presented to ED after he had started vomiting bright red blood this morning.  He had 2 separate episodes of hematemesis and he also had some melanotic stools.  He reports he had platelet transfusions in the past prior to surgery but no blood transfusions.  He says that he has no abdominal pain or chest pain.  He denies palpitations.  He denies SOB.  He reports he feels weaker than normal.  He is mostly active. He is a nonsmoker and non alcoholic drinker.   ? ?In the ED he was stool guaiac positive.  His vitals were fortunately stable.  His hemoglobin was 10.9 and platelets down to 69.  His potassium was 5.3.  His glucose was 196 and creatinine was 1.44.  He was typed and screened.  He was given IV fluid and an abdominal US and GI service was consulted and requested medical admission.   ? ?09/23/2021: Pt for EGD today.  Hg stable.  Follow up GI recommendations.  ?  ?Assessment and Plan: ?* Acute upper GI hemorrhage ?-- admit to telemetry ?-- monitor H/H ?-- continue IV protonix BID per GI recommendation ?-- octreotide ordered per GI service   ?-- endoscopy per GI recommendation  ?-- type and screen  ?-- supportive measures as ordered  ?-- NPO pending GI eval and endoscopy results : further recs to follow.  ? ?Hyperkalemia ?-- secondary to ACEI, gave 1 time dose of IV lasix, holding ACEI ?-- follow BMP:  K down to 4.9 today  ? ?Guaiac positive stools ?-- upper endoscopy planned by GI service ? ?Melena ?-- GI  service planning endoscopy later today ?-- pt had a nonbloody stool overnight ? ?Arthritis ?-- avoid NSAIDS ?-- acetaminophen PRN  ? ?Epistaxis ?-- not present at this time  ? ?Excessive bleeding ?-- bleeding controlled at this time but following closely  ? ?History of staph infection ?-- he was treated for this years ago with PICC line and longterm antibiotics ? ?Hyperlipidemia ?-- diet controlled, not on statin at this time  ? ?NASH (nonalcoholic steatohepatitis) ?-- likely cause of RBC indices  ?-- follow up abdominal US :  He has splenomegaly and presumed liver cirrhosis ?-- follow up GI consult and recommendations ? ?Vitamin B12 deficiency ?-- follow up B12 level: 929  ? ?Vitamin D deficiency ?-- reports not currently on supplements ?-- follow up 25-OH vitamin D: 36: low normal range ? ?Chronic thrombocytopenia ?-- He saw a hematologist in 2022 and the thought was that NASH was causing this ?-- follow closely, transfuse if needed ? ?Chronic kidney disease ?-- reports h/o nephrolithiasis  ?-- following closely ? ? ?Type 2 diabetes mellitus (HCC) ?-- check CBG and SSI coverage ordered.  ?-- hold home oral diabetes medications ?-- A1c 7.2% which is evidence of uncontrolled disease ? ?CBG (last 3)  ?Recent Labs  ?  09/23/21 ?2706 09/23/21 ?1104 09/23/21 ?1258  ?GLUCAP 214* 171* 150*  ? ? ? ?Gout ?-- resume home allopurinol when able to take p.o.  ? ?Hypertension ?-- IV meds for now, hold home ACEI  due to hyperkalemia and NPO status ? ?DVT prophylaxis: SCD ?Code Status: full  ?Family Communication: wife at bedside  ?Disposition: Status is: Inpatient ?Remains inpatient appropriate because: IV fluids required ?  ?Consultants:  ?GI service  ?Procedures:  ?Endoscopy 09/23/21 ?Antimicrobials:  ?  ?Subjective: ?Pt reports he had a nonbloody bowel movement overnight.  ?Objective: ?Vitals:  ? 09/23/21 0514 09/23/21 0901 09/23/21 0908 09/23/21 1239  ?BP: 98/65 (!) 88/51 93/63 101/67  ?Pulse: 81 77 73 83  ?Resp: '18 18  19   '$ ?Temp: 97.8 ?F (36.6 ?C) 97.9 ?F (36.6 ?C)  98.3 ?F (36.8 ?C)  ?TempSrc: Oral   Oral  ?SpO2: 99% 98%  95%  ?Weight:    110.2 kg  ?Height:    '6\' 1"'$  (1.854 m)  ? ? ?Intake/Output Summary (Last 24 hours) at 09/23/2021 1312 ?Last data filed at 09/22/2021 1837 ?Gross per 24 hour  ?Intake 3.93 ml  ?Output 500 ml  ?Net -496.07 ml  ? ?Filed Weights  ? 09/22/21 1040 09/23/21 1239  ?Weight: 110.2 kg 110.2 kg  ? ?Examination: ? ?General exam: Appears calm and comfortable  ?Respiratory system: Clear to auscultation. Respiratory effort normal. ?Cardiovascular system: normal S1 & S2 heard. No JVD, murmurs, rubs, gallops or clicks. No pedal edema. ?Gastrointestinal system: Abdomen is nondistended, soft and nontender. No organomegaly or masses felt. Normal bowel sounds heard. ?Central nervous system: Alert and oriented. No focal neurological deficits. ?Extremities: Symmetric 5 x 5 power. ?Skin: No rashes, lesions or ulcers. ?Psychiatry: Judgement and insight appear normal. Mood & affect appropriate.  ? ?Data Reviewed: I have personally reviewed following labs and imaging studies ? ?CBC: ?Recent Labs  ?Lab 09/22/21 ?1158 09/23/21 ?9983  ?WBC 10.2 6.4  ?NEUTROABS 8.3*  --   ?HGB 10.9* 10.3*  ?HCT 32.7* 31.5*  ?MCV 93.2 93.2  ?PLT 69* 67*  ? ? ?Basic Metabolic Panel: ?Recent Labs  ?Lab 09/22/21 ?1158 09/23/21 ?3825  ?NA 136 140  ?K 5.3* 4.9  ?CL 110 111  ?CO2 21* 22  ?GLUCOSE 196* 195*  ?BUN 41* 51*  ?CREATININE 1.44* 1.83*  ?CALCIUM 8.7* 8.8*  ?MG  --  1.7  ? ? ?CBG: ?Recent Labs  ?Lab 09/22/21 ?1711 09/23/21 ?0004 09/23/21 ?0539 09/23/21 ?1104 09/23/21 ?1258  ?GLUCAP 178* 161* 214* 171* 150*  ? ? ?No results found for this or any previous visit (from the past 240 hour(s)).  ? ?Radiology Studies: ?US Abdomen Complete ? ?Result Date: 09/22/2021 ?CLINICAL DATA:  Thrombocytopenia, remote cholecystectomy EXAM: ABDOMEN ULTRASOUND COMPLETE COMPARISON:  None Available. FINDINGS: Very limited scan due to patient habitus and bowel gas.  Gallbladder: Surgically absent. Common bile duct: Diameter: 5 mm Liver: Diffusely echogenic and mildly heterogeneous liver parenchyma with suggestion of mild liver surface irregularity. No liver masses, noting decreased sensitivity in the setting of an echogenic liver. Portal vein is patent on color Doppler imaging with normal direction of blood flow towards the liver. IVC: No abnormality visualized. Pancreas: Not visualized due to overlying gas and habitus. Spleen: Moderate splenomegaly. Splenic volume 1045 cc (14.8 x 18.9 x 7.2 cm). No splenic masses. Right Kidney: Length: 9.7 cm. Echogenicity within normal limits. No mass or hydronephrosis visualized. Left Kidney: Length: 11.5 cm. Echogenicity within normal limits. No mass or hydronephrosis visualized. Abdominal aorta: No aneurysm visualized. Other findings: None. IMPRESSION: 1. Limited scan. Echogenic and mildly heterogeneous liver parenchyma with suggestion of mild liver surface irregularity, cannot exclude liver cirrhosis. Suggest correlation with liver function tests. 2. Bile ducts are within normal limits post  cholecystectomy. CBD diameter 5 mm. 3. Moderate splenomegaly, which may be due to portal venous hypertension. Electronically Signed   By: Ilona Sorrel M.D.   On: 09/22/2021 14:24   ? ?Scheduled Meds: ? [MAR Hold] allopurinol  300 mg Oral Daily  ? [MAR Hold] insulin aspart  0-9 Units Subcutaneous Q6H  ? [MAR Hold] octreotide  50 mcg Intravenous Once  ? [MAR Hold] pantoprazole (PROTONIX) IV  40 mg Intravenous Q12H  ? [MAR Hold] traZODone  100 mg Oral QHS  ? ?Continuous Infusions: ? sodium chloride    ? [MAR Hold] cefTRIAXone (ROCEPHIN)  IV 1 g (09/22/21 1657)  ? lactated ringers 50 mL/hr at 09/23/21 1247  ? [MAR Hold] octreotide  (SANDOSTATIN)    IV infusion 50 mcg/hr (09/23/21 1157)  ? ? ? LOS: 1 day  ? ?Time spent: 36 mins ? ?Irwin Brakeman, MD ?How to contact the [facility] Attending or Consulting provider [place] or covering provider during after hours [place], for this patient?  ?Check the care team in [facility] and look for a) attending/consulting TRH provider listed and b) the [facility] team listed ?Log into [email or web address] and use [place]'s universal password to access. If

## 2021-09-23 NOTE — Anesthesia Preprocedure Evaluation (Signed)
Anesthesia Evaluation  ?Patient identified by MRN, date of birth, ID band ?Patient awake ? ? ? ?Reviewed: ?Allergy & Precautions, NPO status , Patient's Chart, lab work & pertinent test results ? ?Airway ?Mallampati: II ? ?TM Distance: >3 FB ?Neck ROM: Full ? ? ? Dental ? ?(+) Dental Advisory Given, Teeth Intact ?  ?Pulmonary ?neg pulmonary ROS,  ?  ?Pulmonary exam normal ?breath sounds clear to auscultation ? ? ? ? ? ? Cardiovascular ?Exercise Tolerance: Good ?hypertension, Pt. on medications ?Normal cardiovascular exam ?Rhythm:Regular Rate:Normal ? ? ?  ?Neuro/Psych ?negative neurological ROS ? negative psych ROS  ? GI/Hepatic ?negative GI ROS, (+) Cirrhosis  ?  ?  ? , Hepatitis -, Toxin Related  ?Endo/Other  ?diabetes, Well Controlled, Type 2, Oral Hypoglycemic Agents ? Renal/GU ?Renal InsufficiencyRenal disease  ?negative genitourinary ?  ?Musculoskeletal ? ?(+) Arthritis , Osteoarthritis,   ? Abdominal ?  ?Peds ?negative pediatric ROS ?(+)  Hematology ? ?(+) Blood dyscrasia (thrombocytopenia), anemia ,   ?Anesthesia Other Findings ? ? Reproductive/Obstetrics ?negative OB ROS ? ?  ? ? ? ? ? ? ? ? ? ? ? ? ? ?  ?  ? ? ? ? ? ? ? ?Anesthesia Physical ?Anesthesia Plan ? ?ASA: 3 ? ?Anesthesia Plan: General  ? ?Post-op Pain Management: Minimal or no pain anticipated  ? ?Induction: Intravenous ? ?PONV Risk Score and Plan:  ? ?Airway Management Planned: Nasal Cannula and Natural Airway ? ?Additional Equipment:  ? ?Intra-op Plan:  ? ?Post-operative Plan:  ? ?Informed Consent: I have reviewed the patients History and Physical, chart, labs and discussed the procedure including the risks, benefits and alternatives for the proposed anesthesia with the patient or authorized representative who has indicated his/her understanding and acceptance.  ? ? ? ?Dental advisory given ? ?Plan Discussed with: CRNA and Surgeon ? ?Anesthesia Plan Comments:   ? ? ? ? ? ? ?Anesthesia Quick Evaluation ? ?

## 2021-09-24 ENCOUNTER — Other Ambulatory Visit (INDEPENDENT_AMBULATORY_CARE_PROVIDER_SITE_OTHER): Payer: Self-pay

## 2021-09-24 ENCOUNTER — Encounter (INDEPENDENT_AMBULATORY_CARE_PROVIDER_SITE_OTHER): Payer: Self-pay

## 2021-09-24 ENCOUNTER — Telehealth (INDEPENDENT_AMBULATORY_CARE_PROVIDER_SITE_OTHER): Payer: Self-pay | Admitting: Gastroenterology

## 2021-09-24 DIAGNOSIS — K921 Melena: Secondary | ICD-10-CM

## 2021-09-24 DIAGNOSIS — K922 Gastrointestinal hemorrhage, unspecified: Secondary | ICD-10-CM | POA: Diagnosis not present

## 2021-09-24 DIAGNOSIS — N189 Chronic kidney disease, unspecified: Secondary | ICD-10-CM | POA: Diagnosis not present

## 2021-09-24 DIAGNOSIS — K7460 Unspecified cirrhosis of liver: Secondary | ICD-10-CM | POA: Diagnosis not present

## 2021-09-24 DIAGNOSIS — D693 Immune thrombocytopenic purpura: Secondary | ICD-10-CM | POA: Diagnosis not present

## 2021-09-24 DIAGNOSIS — D696 Thrombocytopenia, unspecified: Secondary | ICD-10-CM

## 2021-09-24 DIAGNOSIS — R195 Other fecal abnormalities: Secondary | ICD-10-CM | POA: Diagnosis not present

## 2021-09-24 DIAGNOSIS — I8500 Esophageal varices without bleeding: Secondary | ICD-10-CM

## 2021-09-24 DIAGNOSIS — I8511 Secondary esophageal varices with bleeding: Secondary | ICD-10-CM

## 2021-09-24 LAB — GLUCOSE, CAPILLARY
Glucose-Capillary: 160 mg/dL — ABNORMAL HIGH (ref 70–99)
Glucose-Capillary: 164 mg/dL — ABNORMAL HIGH (ref 70–99)
Glucose-Capillary: 176 mg/dL — ABNORMAL HIGH (ref 70–99)
Glucose-Capillary: 187 mg/dL — ABNORMAL HIGH (ref 70–99)

## 2021-09-24 LAB — COMPREHENSIVE METABOLIC PANEL
ALT: 20 U/L (ref 0–44)
AST: 21 U/L (ref 15–41)
Albumin: 3.2 g/dL — ABNORMAL LOW (ref 3.5–5.0)
Alkaline Phosphatase: 43 U/L (ref 38–126)
Anion gap: 6 (ref 5–15)
BUN: 39 mg/dL — ABNORMAL HIGH (ref 8–23)
CO2: 22 mmol/L (ref 22–32)
Calcium: 8.1 mg/dL — ABNORMAL LOW (ref 8.9–10.3)
Chloride: 110 mmol/L (ref 98–111)
Creatinine, Ser: 1.62 mg/dL — ABNORMAL HIGH (ref 0.61–1.24)
GFR, Estimated: 48 mL/min — ABNORMAL LOW (ref >60)
Glucose, Bld: 163 mg/dL — ABNORMAL HIGH (ref 70–99)
Potassium: 4.2 mmol/L (ref 3.5–5.1)
Sodium: 138 mmol/L (ref 135–145)
Total Bilirubin: 0.7 mg/dL (ref 0.3–1.2)
Total Protein: 5.7 g/dL — ABNORMAL LOW (ref 6.5–8.1)

## 2021-09-24 LAB — CBC
HCT: 25.5 % — ABNORMAL LOW (ref 39.0–52.0)
Hemoglobin: 8.4 g/dL — ABNORMAL LOW (ref 13.0–17.0)
MCH: 31 pg (ref 26.0–34.0)
MCHC: 32.9 g/dL (ref 30.0–36.0)
MCV: 94.1 fL (ref 80.0–100.0)
Platelets: 55 10*3/uL — ABNORMAL LOW (ref 150–400)
RBC: 2.71 MIL/uL — ABNORMAL LOW (ref 4.22–5.81)
RDW: 15.5 % (ref 11.5–15.5)
WBC: 4.7 10*3/uL (ref 4.0–10.5)
nRBC: 0 % (ref 0.0–0.2)

## 2021-09-24 LAB — HEPATITIS PANEL, ACUTE
HCV Ab: NONREACTIVE
Hep A IgM: NONREACTIVE
Hep B C IgM: NONREACTIVE
Hepatitis B Surface Ag: NONREACTIVE

## 2021-09-24 LAB — MAGNESIUM: Magnesium: 1.7 mg/dL (ref 1.7–2.4)

## 2021-09-24 NOTE — Progress Notes (Signed)
?Subjective: ?States he is feeling okay today. No nausea or vomiting. One small BM this morning that was darker in color, very small in volume. No BRBPR. States that he has some burning in his chest when he drinks something, states this began after EGD yesterday. States he has some swelling to his Left hand upon waking this morning. Denies any pain to arm or hand.  ? ?Objective: ?Vital signs in last 24 hours: ?Temp:  [97.3 ?F (36.3 ?C)-98.3 ?F (36.8 ?C)] 98.3 ?F (36.8 ?C) (05/17 0504) ?Pulse Rate:  [75-87] 75 (05/17 0504) ?Resp:  [11-22] 18 (05/17 0504) ?BP: (87-108)/(44-70) 101/63 (05/17 0504) ?SpO2:  [95 %-100 %] 96 % (05/17 0504) ?Weight:  [110.2 kg] 110.2 kg (05/16 1239) ?Last BM Date : 09/22/21 ?General:   Alert and oriented, pleasant ?Head:  Normocephalic and atraumatic. ?Eyes:  No icterus, sclera clear. Conjuctiva pink.  ?Mouth:  Without lesions, mucosa pink and moist.  ?Neck:  Supple, without thyromegaly or masses.  ?Heart:  S1, S2 present, no murmurs noted.  ?Lungs: Clear to auscultation bilaterally, without wheezing, rales, or rhonchi.  ?Abdomen:  Bowel sounds present, soft, non-tender, non-distended. No HSM or hernias noted. No rebound or guarding. No masses appreciated  ?Msk:  mild edema to left hand, no erythema or other abnormalities noted ?Pulses:  Normal pulses noted. ?Extremities:  Without clubbing or edema. ?Neurologic:  Alert and  oriented x4;  grossly normal neurologically. No asterixis ?Skin:  Warm and dry, intact without significant lesions.  ?Psych:  Alert and cooperative. Normal mood and affect. ? ?Intake/Output from previous day: ?05/16 0701 - 05/17 0700 ?In: 1705.7 [P.O.:960; I.V.:745.7] ?Out: -  ?Intake/Output this shift: ?No intake/output data recorded. ? ?Lab Results: ?Recent Labs  ?  09/22/21 ?1158 09/23/21 ?3009 09/24/21 ?2330  ?WBC 10.2 6.4 4.7  ?HGB 10.9* 10.3* 8.4*  ?HCT 32.7* 31.5* 25.5*  ?PLT 69* 67* 55*  ? ?BMET ?Recent Labs  ?  09/22/21 ?1158 09/23/21 ?0762 09/24/21 ?2633  ?NA  136 140 138  ?K 5.3* 4.9 4.2  ?CL 110 111 110  ?CO2 21* 22 22  ?GLUCOSE 196* 195* 163*  ?BUN 41* 51* 39*  ?CREATININE 1.44* 1.83* 1.62*  ?CALCIUM 8.7* 8.8* 8.1*  ? ?LFT ?Recent Labs  ?  09/22/21 ?1158 09/23/21 ?3545 09/24/21 ?6256  ?PROT 6.7 6.4* 5.7*  ?ALBUMIN 3.8 3.6 3.2*  ?AST '23 20 21  '$ ?ALT '22 19 20  '$ ?ALKPHOS 57 49 43  ?BILITOT 1.1 0.9 0.7  ? ?PT/INR ?Recent Labs  ?  09/22/21 ?1158  ?LABPROT 15.2  ?INR 1.2  ? ? ?Studies/Results: ?US Abdomen Complete ? ?Result Date: 09/22/2021 ?CLINICAL DATA:  Thrombocytopenia, remote cholecystectomy EXAM: ABDOMEN ULTRASOUND COMPLETE COMPARISON:  None Available. FINDINGS: Very limited scan due to patient habitus and bowel gas. Gallbladder: Surgically absent. Common bile duct: Diameter: 5 mm Liver: Diffusely echogenic and mildly heterogeneous liver parenchyma with suggestion of mild liver surface irregularity. No liver masses, noting decreased sensitivity in the setting of an echogenic liver. Portal vein is patent on color Doppler imaging with normal direction of blood flow towards the liver. IVC: No abnormality visualized. Pancreas: Not visualized due to overlying gas and habitus. Spleen: Moderate splenomegaly. Splenic volume 1045 cc (14.8 x 18.9 x 7.2 cm). No splenic masses. Right Kidney: Length: 9.7 cm. Echogenicity within normal limits. No mass or hydronephrosis visualized. Left Kidney: Length: 11.5 cm. Echogenicity within normal limits. No mass or hydronephrosis visualized. Abdominal aorta: No aneurysm visualized. Other findings: None. IMPRESSION: 1. Limited scan. Echogenic and mildly  heterogeneous liver parenchyma with suggestion of mild liver surface irregularity, cannot exclude liver cirrhosis. Suggest correlation with liver function tests. 2. Bile ducts are within normal limits post cholecystectomy. CBD diameter 5 mm. 3. Moderate splenomegaly, which may be due to portal venous hypertension. Electronically Signed   By: Ilona Sorrel M.D.   On: 09/22/2021 14:24    ? ?Assessment: ?George Oneill is a 61 year old male with medical history of pancytopenia, followed by [facility] Hematology, presenting with acute hematemesis and hematochezia to the ED on monday. History of fatty liver with splenomegaly on [place] in [place] Jan 2021. Chronic thrombocytopenia. Admitting Hgb 10.9. Last Hgb on file from Aug 2022 was 13.3.  ? ?Cirrhosis: [place] Abd Complete done 5/15 with concern for cirrhosis, normal Bile ducts s/p cholecystectomy in the past, moderate splenomegaly, question secondary to portal venous htn. pt reports no previous diagnosis of cirrhosis. Likely secondary to hx of fatty liver, dx 10 years ago (per patient) though definite etiology unknown at this time. AFP, ANA, AMA, ASMA in process. Will order hepatitis panel to rule out viral etiology of cirrhosis. Plt count 55k, LFTs WNL. INR 1.2 on 5/15. Discussion had with patient regarding possible etiology of cirrhosis as well as implications of disease to include importance of routine labs and imaging every 6 months with increased risk of incidence of HCC, bleeding, especially with known esophageal varices, retention of ammonia/fluid, importance of avoiding alcohol and NSAIDs. Pt verbalized understanding of this.  ? ?Hematochezia/hematemesis/anemia: EGD yesterday with 3 columns of esophageal varices.  2 columns of very small. Third column grade 3 with red markings on it. This column banded at 2 levels.Mild portal hypertensive gastropathy. No evidence of gastric varices or peptic ulcer disease. Hgb 8.4 down from 10.3 yesterday. One very small volume BM this morning, darker in color. Pt denies sob, dizziness or lightheadedness. No nausea, vomiting or abdominal pain. Discussed with Hospitalist Dr. Joesph Fillers, likely can plan for d/c tomorrow as long as hgb remains stable. Follow up in GI office in 2-3 weeks with repeat CBC in 1 week and EGD in 4 weeks.  ? ? ?Plan: ?Continue rocephin 1g Q24h ?Monitor H&H, Transfuse hgb <7 ?Monitor for  overt GI bleeding ?Continue octreotide to complete 72hr course ?Repeat EGD in 4 weeks  ?CBC in 1 week with outpatient GI f/u 2-3 weeks  ?Avoid all NSAIDs and ETOH ?Consider updating screening colonoscopy on outpatient basis ? ? ? LOS: 2 days  ? ? 09/24/2021, 9:34 AM ? ?Tasia Liz L. Alver Sorrow, MSN, APRN, AGNP-C ?Adult-Gerontology Nurse Practitioner ?[facility] ?

## 2021-09-24 NOTE — Progress Notes (Signed)
PROGRESS NOTE   SELDEN NOTEBOOM  GQQ:[id number] DOB: 04-23-1961 DOA: 09/22/2021 PCP: Manon Hilding, MD   Chief Complaint  Patient presents with   Hematemesis   Level of care: Telemetry  Brief Admission History:  61 year old gentleman with history of NASH and associated with pancytopenia and chronic thrombocytopenia, anemia, splenomegaly, epistaxis, hypertension, type 2 diabetes mellitus, osteoarthritis, vitamin D deficiency, vitamin B12 deficiency, history of staph infection after knee surgery who presented to ED after he had started vomiting bright red blood this morning.  He had 2 separate episodes of hematemesis and he also had some melanotic stools.  He reports he had platelet transfusions in the past prior to surgery but no blood transfusions.  He says that he has no abdominal pain or chest pain.  He denies palpitations.  He denies SOB.  He reports he feels weaker than normal.  He is mostly active. He is a nonsmoker and non alcoholic drinker.    In the ED he was stool guaiac positive.  His vitals were fortunately stable.  His hemoglobin was 10.9 and platelets down to 69.  His potassium was 5.3.  His glucose was 196 and creatinine was 1.44.  He was typed and screened.  He was given IV fluid and an abdominal US and GI service was consulted and requested medical admission.    09/23/2021: Pt for EGD today.  Hg stable.  Follow up GI recommendations.    Assessment and Plan: 1)Acute upper GI hemorrhage -- EGD 09/23/21 with 3 columns of esophageal varices.  2 columns of very small grade 1 varices,  Third column grade 3 with red markings on it. This column banded at 2 levels.Mild portal hypertensive gastropathy. No evidence of gastric varices or peptic ulcer disease.  Hgb 8.4 down from 10.3 yesterday continue octreotide and Protonix --Per GI service okay to advance diet -Patient had 1 dark BM today, Hgb dropping as above -Possible discharge in 24 to 48 hours if no further bleeding and Hgb  stable  Hyperkalemia -- Normalized with interventions  Arthritis -- avoid NSAIDS -- acetaminophen PRN   History of staph infection -- Previously completed treatment quite a while ago  Hyperlipidemia -- diet controlled, not on statin at this time   NASH (nonalcoholic steatohepatitis) -- likely cause of RBC indices  -- follow up abdominal US :  He has splenomegaly and presumed liver cirrhosis -- follow up GI consult and recommendations  Vitamin B12 deficiency -- follow up B12 level: 929   Vitamin D deficiency -- reports not currently on supplements -- follow up 25-OH vitamin D: 36: low normal range  Chronic thrombocytopenia -- He saw a hematologist in 2022 and the thought was that NASH was causing this -- follow closely, transfuse if needed  Chronic kidney disease -- reports h/o nephrolithiasis  -- following closely   Type 2 diabetes mellitus ([place]) -Use Novolog/Humalog Sliding scale insulin with Accu-Cheks/Fingersticks as ordered  -- hold home oral diabetes medications -- A1c 7.2% which is evidence of uncontrolled disease  Gout -- resume home allopurinol when able to take p.o.   Hypertension -- IV meds for now, hold home ACEI due to hyperkalemia and NPO status  DVT prophylaxis: SCD Code Status: full  Family Communication: wife at bedside  Disposition: Status is: Inpatient Remains inpatient appropriate because: IV protonix and fluids required   Consultants:  GI service  Procedures:  Endoscopy 09/23/21 Antimicrobials:    Subjective: -dark stool  today patient is hungry and wants to eat more  No  Nausea, Vomiting or Diarrhea  Objective: Vitals:   09/23/21 1510 09/23/21 2055 09/24/21 0504 09/24/21 1402  BP: 104/70 97/60 101/63 97/63  Pulse: 84 82 75 73  Resp: '18 19 18 20  '$ Temp: 98 F (36.7 C) 98 F (36.7 C) 98.3 F (36.8 C) 98.5 F (36.9 C)  TempSrc:  Oral  Oral  SpO2: 98% 96% 96% 96%  Weight:      Height:        Intake/Output Summary (Last 24  hours) at 09/24/2021 1739 Last data filed at 09/24/2021 0500 Gross per 24 hour  Intake 525.65 ml  Output --  Net 525.65 ml   Filed Weights   09/22/21 1040 09/23/21 1239  Weight: 110.2 kg 110.2 kg   Examination:  General exam: Appears calm and comfortable  Respiratory system: Clear to auscultation. Respiratory effort normal. Cardiovascular system: normal S1 & S2 heard.  Gastrointestinal system: Abdomen is nondistended, soft and nontender.  Normal bowel sounds heard. Central nervous system: Alert and oriented. No focal neurological deficits. Extremities: Symmetric 5 x 5 power. Skin: No rashes, lesions or ulcers. Psychiatry: Judgement and insight appear normal. Mood & affect appropriate.   Data Reviewed: I have personally reviewed following labs and imaging studies  CBC: Recent Labs  Lab 09/22/21 1158 09/23/21 0523 09/24/21 0534  WBC 10.2 6.4 4.7  NEUTROABS 8.3*  --   --   HGB 10.9* 10.3* 8.4*  HCT 32.7* 31.5* 25.5*  MCV 93.2 93.2 94.1  PLT 69* 67* 55*    Basic Metabolic Panel: Recent Labs  Lab 09/22/21 1158 09/23/21 0523 09/24/21 0534  NA 136 140 138  K 5.3* 4.9 4.2  CL 110 111 110  CO2 21* 22 22  GLUCOSE 196* 195* 163*  BUN 41* 51* 39*  CREATININE 1.44* 1.83* 1.62*  CALCIUM 8.7* 8.8* 8.1*  MG  --  1.7 1.7    CBG: Recent Labs  Lab 09/23/21 1758 09/23/21 2338 09/24/21 0505 09/24/21 1157 09/24/21 1735  GLUCAP 279* 160* 164* 176* 187*    No results found for this or any previous visit (from the past 240 hour(s)).   Radiology Studies: No results found.  Scheduled Meds:  allopurinol  300 mg Oral Daily   insulin aspart  0-9 Units Subcutaneous Q6H   pantoprazole (PROTONIX) IV  40 mg Intravenous Q12H   traZODone  100 mg Oral QHS   Continuous Infusions:  cefTRIAXone (ROCEPHIN)  IV 1 g (09/24/21 1348)   octreotide  (SANDOSTATIN)    IV infusion 25 mcg/hr (09/24/21 1737)    LOS: 2 days   Roxan Hockey, MD How to contact the [facility]: [facility] Rhonda W Attending or  Consulting provider [place] or covering provider during after hours [place], for this patient?  Check the care team in [facility] and look for a) attending/consulting TRH provider listed and b) the [facility] team listed Log into [email or web address] and use [place]'s universal password to access. If you do not have the password, please contact the hospital operator. Locate the [facility] provider you are looking for under [facility] and page to a number that you can be directly reached. If you still have difficulty reaching the provider, please page the [facility] (Director on Call) for the Hospitalists listed on amion for assistance.  09/24/2021, 5:39 PM

## 2021-09-24 NOTE — Progress Notes (Signed)
Inpatient Diabetes Program Recommendations ? ?AACE/ADA: New Consensus Statement on Inpatient Glycemic Control  ? ?Target Ranges:  Prepandial:   less than 140 mg/dL ?     Peak postprandial:   less than 180 mg/dL (1-2 hours) ?     Critically ill patients:  140 - 180 mg/dL  ? ? Latest Reference Range & Units 09/23/21 05:48 09/23/21 11:04 09/23/21 12:58 09/23/21 17:58 09/23/21 23:38 09/24/21 05:05  ?Glucose-Capillary 70 - 99 mg/dL 214 (H) 171 (H) 150 (H) 279 (H) 160 (H) 164 (H)  ? ?Review of Glycemic Control ? ?Diabetes history: DM2 ?Outpatient Diabetes medications: Metformin 1000 mg BID ?Current orders for Inpatient glycemic control: Novolog 0-9 units Q6H ? ?Inpatient Diabetes Program Recommendations:   ? ?Insulin: Please consider changing CBGs to AC&HS and Novolog to 0-15 units AC&HS. ? ?Thanks, ?Barnie Alderman, RN, MSN, CDE ?Diabetes Coordinator ?Inpatient Diabetes Program ?[phone] (Team Pager from 8am to 5pm) ? ? ? ?

## 2021-09-24 NOTE — Telephone Encounter (Signed)
See inpatient progress note

## 2021-09-25 ENCOUNTER — Other Ambulatory Visit: Payer: Self-pay

## 2021-09-25 ENCOUNTER — Telehealth: Payer: Self-pay | Admitting: Gastroenterology

## 2021-09-25 DIAGNOSIS — K920 Hematemesis: Secondary | ICD-10-CM

## 2021-09-25 DIAGNOSIS — D500 Iron deficiency anemia secondary to blood loss (chronic): Secondary | ICD-10-CM

## 2021-09-25 DIAGNOSIS — K922 Gastrointestinal hemorrhage, unspecified: Secondary | ICD-10-CM | POA: Diagnosis not present

## 2021-09-25 LAB — GLUCOSE, CAPILLARY
Glucose-Capillary: 153 mg/dL — ABNORMAL HIGH (ref 70–99)
Glucose-Capillary: 267 mg/dL — ABNORMAL HIGH (ref 70–99)

## 2021-09-25 LAB — MAGNESIUM: Magnesium: 1.8 mg/dL (ref 1.7–2.4)

## 2021-09-25 LAB — CBC
HCT: 26.1 % — ABNORMAL LOW (ref 39.0–52.0)
Hemoglobin: 8.2 g/dL — ABNORMAL LOW (ref 13.0–17.0)
MCH: 29.7 pg (ref 26.0–34.0)
MCHC: 31.4 g/dL (ref 30.0–36.0)
MCV: 94.6 fL (ref 80.0–100.0)
Platelets: 46 10*3/uL — ABNORMAL LOW (ref 150–400)
RBC: 2.76 MIL/uL — ABNORMAL LOW (ref 4.22–5.81)
RDW: 15.1 % (ref 11.5–15.5)
WBC: 4.2 10*3/uL (ref 4.0–10.5)
nRBC: 0 % (ref 0.0–0.2)

## 2021-09-25 LAB — COMPREHENSIVE METABOLIC PANEL
ALT: 22 U/L (ref 0–44)
AST: 25 U/L (ref 15–41)
Albumin: 3.3 g/dL — ABNORMAL LOW (ref 3.5–5.0)
Alkaline Phosphatase: 43 U/L (ref 38–126)
Anion gap: 8 (ref 5–15)
BUN: 28 mg/dL — ABNORMAL HIGH (ref 8–23)
CO2: 22 mmol/L (ref 22–32)
Calcium: 8 mg/dL — ABNORMAL LOW (ref 8.9–10.3)
Chloride: 108 mmol/L (ref 98–111)
Creatinine, Ser: 1.65 mg/dL — ABNORMAL HIGH (ref 0.61–1.24)
GFR, Estimated: 47 mL/min — ABNORMAL LOW (ref >60)
Glucose, Bld: 165 mg/dL — ABNORMAL HIGH (ref 70–99)
Potassium: 4.2 mmol/L (ref 3.5–5.1)
Sodium: 138 mmol/L (ref 135–145)
Total Bilirubin: 0.6 mg/dL (ref 0.3–1.2)
Total Protein: 5.8 g/dL — ABNORMAL LOW (ref 6.5–8.1)

## 2021-09-25 LAB — AFP TUMOR MARKER: AFP, Serum, Tumor Marker: <1.8 ng/mL (ref 0.0–8.4)

## 2021-09-25 LAB — ANA: Anti Nuclear Antibody (ANA): NEGATIVE

## 2021-09-25 LAB — MITOCHONDRIAL ANTIBODIES: Mitochondrial M2 Ab, IgG: <20 Units (ref 0.0–20.0)

## 2021-09-25 LAB — ANTI-SMOOTH MUSCLE ANTIBODY, IGG: F-Actin IgG: 5 Units (ref 0–19)

## 2021-09-25 MED ORDER — ACETAMINOPHEN 325 MG PO TABS: 650.0000 mg | ORAL_TABLET | Freq: Four times a day (QID) | ORAL | 0 refills | Status: DC | PRN

## 2021-09-25 MED ORDER — PANTOPRAZOLE SODIUM 40 MG PO TBEC: 40.0000 mg | DELAYED_RELEASE_TABLET | Freq: Two times a day (BID) | ORAL | 5 refills | Status: DC

## 2021-09-25 MED ORDER — LISINOPRIL 5 MG PO TABS: 5.0000 mg | ORAL_TABLET | Freq: Every day | ORAL | 11 refills | Status: DC

## 2021-09-25 MED ORDER — PANTOPRAZOLE SODIUM 40 MG PO TBEC
40.0000 mg | DELAYED_RELEASE_TABLET | Freq: Two times a day (BID) | ORAL | Status: DC
Start: 2021-09-25 — End: 2021-09-25
  Administered 2021-09-25: 40 mg via ORAL
  Filled 2021-09-25: qty 1

## 2021-09-25 NOTE — Telephone Encounter (Signed)
Noted. Will schedule at [place]

## 2021-09-25 NOTE — Telephone Encounter (Signed)
[facility] patient, they already have patient set up for lab work.

## 2021-09-25 NOTE — Telephone Encounter (Signed)
Erline Levine - Please arrange hospital follow up for this patient with Dr. Abbey Chatters in about 2 weeks.  Dena - Please arrange CBC in 1 week for this patient. Dx: anemia, hematemesis   Clinical Pool - Patient needs repeat EGD in 4 weeks, he will see Dr. Abbey Chatters prior to procedure. Dx. Cirrhosis, esophageal varices  .Venetia Night, MSN, FNP-BC, AGACNP-BC [facility]
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
## 2021-09-25 NOTE — Plan of Care (Signed)

## 2021-09-25 NOTE — Telephone Encounter (Signed)
Phoned and advised the pt to have labs done @ [facility] in one week. Pt expressed understanding

## 2021-09-25 NOTE — Discharge Instructions (Signed)
1)Avoid ibuprofen/Advil/Aleve/Motrin/Goody Powders/Naproxen/BC powders/Meloxicam/Diclofenac/Indomethacin and other Nonsteroidal anti-inflammatory medications as these will make you more likely to bleed and can cause stomach ulcers, can also cause Kidney problems.   2)Follow-up Gastroenterologist Dr. Hurshel Keys with [facility]--- in 4 weeks for possible endoscopy and for Re-evaluation  -address: [address], [place],  [address], Phone: [phone]  3)Repeat CBC Blood test around Monday 09/29/21

## 2021-09-25 NOTE — Telephone Encounter (Signed)
Noted , labs done

## 2021-09-26 ENCOUNTER — Encounter (HOSPITAL_COMMUNITY): Payer: Self-pay | Admitting: Internal Medicine

## 2021-10-21 ENCOUNTER — Ambulatory Visit (INDEPENDENT_AMBULATORY_CARE_PROVIDER_SITE_OTHER): Payer: BLUE CROSS/BLUE SHIELD | Admitting: Gastroenterology

## 2021-10-23 ENCOUNTER — Telehealth: Payer: Self-pay | Admitting: *Deleted

## 2021-10-23 ENCOUNTER — Ambulatory Visit (INDEPENDENT_AMBULATORY_CARE_PROVIDER_SITE_OTHER): Payer: No Typology Code available for payment source | Admitting: Internal Medicine

## 2021-10-23 ENCOUNTER — Encounter: Payer: Self-pay | Admitting: Internal Medicine

## 2021-10-23 VITALS — BP 149/74 | HR 76 | Temp 98.0°F | Ht 73.0 in | Wt 244.4 lb

## 2021-10-23 DIAGNOSIS — I8511 Secondary esophageal varices with bleeding: Secondary | ICD-10-CM

## 2021-10-23 DIAGNOSIS — Z1211 Encounter for screening for malignant neoplasm of colon: Secondary | ICD-10-CM | POA: Diagnosis not present

## 2021-10-23 DIAGNOSIS — K7460 Unspecified cirrhosis of liver: Secondary | ICD-10-CM | POA: Diagnosis not present

## 2021-10-23 DIAGNOSIS — K219 Gastro-esophageal reflux disease without esophagitis: Secondary | ICD-10-CM

## 2021-10-23 MED ORDER — PROPRANOLOL HCL 20 MG PO TABS: 20.0000 mg | ORAL_TABLET | Freq: Two times a day (BID) | ORAL | 5 refills | Status: DC

## 2021-10-23 MED ORDER — PEG 3350-KCL-NA BICARB-NACL 420 G PO SOLR: ORAL | 0 refills | Status: DC

## 2021-10-23 NOTE — Patient Instructions (Addendum)
I am happy to see that you are feeling better.  We will schedule you for upper endoscopy to reevaluate your esophageal varices.  At the same time we will perform colonoscopy for colon cancer screening purposes.  I am going to start you on a new medication called propranolol 20 mg twice daily in an effort to reduce risk of recurrent bleeding.  Goal heart rate is 55-60.  You may experience lightheadedness.  Keep an eye on your blood pressure and heart rate at home.  We will need to repeat ultrasound of your liver in November for liver cancer screening as you are at a slightly increased risk for this given your cirrhosis.  Recommend high-protein low-salt diet.  Try to keep your diabetes under good control.  Try to eat a healthy diet and lose weight.  Follow-up in 3 months.  It was great seeing you again today.  Dr. Abbey Chatters

## 2021-10-23 NOTE — Telephone Encounter (Signed)
Called pt. Scheduled tcs/egd/esophageal banding with Dr. Abbey Chatters, ASA 3 on 7/17 at 9:45am. Aware will mail instructions/pre-op appt. Rx for prep sent in.
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
## 2021-10-24 ENCOUNTER — Encounter: Payer: Self-pay | Admitting: *Deleted

## 2021-10-31 ENCOUNTER — Other Ambulatory Visit (HOSPITAL_COMMUNITY): Payer: BLUE CROSS/BLUE SHIELD

## 2021-11-04 ENCOUNTER — Encounter (HOSPITAL_COMMUNITY): Payer: Self-pay

## 2021-11-04 ENCOUNTER — Ambulatory Visit (HOSPITAL_COMMUNITY): Admit: 2021-11-04 | Payer: BLUE CROSS/BLUE SHIELD | Admitting: Gastroenterology

## 2021-11-04 SURGERY — ESOPHAGOGASTRODUODENOSCOPY (EGD) WITH PROPOFOL
Anesthesia: Monitor Anesthesia Care

## 2021-11-18 NOTE — Patient Instructions (Signed)
SHAWNMICHAEL Oneill  11/18/2021     '@PREFPERIOPPHARMACY'$ @   Your procedure is scheduled on  11/24/2021.   Report to [facility] at  [place].   Call this number if you have problems the morning of surgery:  [phone]   Remember:  Follow the diet and prep instructions given to you by the office.     DO NOT take any medications for diabetes the morning of your procedure.    Take these medicines the morning of surgery with A SIP OF WATER                       allopurinol, protonix, inderal.     Do not wear jewelry, make-up or nail polish.  Do not wear lotions, powders, or perfumes, or deodorant.  Do not shave 48 hours prior to surgery.  Men may shave face and neck.  Do not bring valuables to the hospital.  [facility] is not responsible for any belongings or valuables.  Contacts, dentures or bridgework may not be worn into surgery.  Leave your suitcase in the car.  After surgery it may be brought to your room.  For patients admitted to the hospital, discharge time will be determined by your treatment team.  Patients discharged the day of surgery will not be allowed to drive home and must have someone with them for 24 hours.    Special instructions:   DO NOT smoke tobacco or vape for 24 hours before your procedure.  Please read over the following fact sheets that you were given. Anesthesia Post-op Instructions and Care and Recovery After Surgery      Upper Endoscopy, Adult, Care After This sheet gives you information about how to care for yourself after your procedure. Your health care provider may also give you more specific instructions. If you have problems or questions, contact your health care provider. What can I expect after the procedure? After the procedure, it is common to have: A sore throat. Mild stomach pain or discomfort. Bloating. Nausea. Follow these instructions at home:  Follow instructions from your health care provider about what to eat  or drink after your procedure. Return to your normal activities as told by your health care provider. Ask your health care provider what activities are safe for you. Take over-the-counter and prescription medicines only as told by your health care provider. If you were given a sedative during the procedure, it can affect you for several hours. Do not drive or operate machinery until your health care provider says that it is safe. Keep all follow-up visits as told by your health care provider. This is important. Contact a health care provider if you have: A sore throat that lasts longer than one day. Trouble swallowing. Get help right away if: You vomit blood or your vomit looks like coffee grounds. You have: A fever. Bloody, black, or tarry stools. A severe sore throat or you cannot swallow. Difficulty breathing. Severe pain in your chest or abdomen. Summary After the procedure, it is common to have a sore throat, mild stomach discomfort, bloating, and nausea. If you were given a sedative during the procedure, it can affect you for several hours. Do not drive or operate machinery until your health care provider says that it is safe. Follow instructions from your health care provider about what to eat or drink after your procedure. Return to your normal activities as told by your  health care provider. This information is not intended to replace advice given to you by your health care provider. Make sure you discuss any questions you have with your health care provider. Document Revised: 03/03/2019 Document Reviewed: 09/27/2017 Elsevier Patient Education  [place]. Colonoscopy, Adult, Care After The following information offers guidance on how to care for yourself after your procedure. Your health care provider may also give you more specific instructions. If you have problems or questions, contact your health care provider. What can I expect after the procedure? After the  procedure, it is common to have: A small amount of blood in your stool for 24 hours after the procedure. Some gas. Mild cramping or bloating of your abdomen. Follow these instructions at home: Eating and drinking  Drink enough fluid to keep your urine pale yellow. Follow instructions from your health care provider about eating or drinking restrictions. Resume your normal diet as told by your health care provider. Avoid heavy or fried foods that are hard to digest. Activity Rest as told by your health care provider. Avoid sitting for a long time without moving. Get up to take short walks every 1-2 hours. This is important to improve blood flow and breathing. Ask for help if you feel weak or unsteady. Return to your normal activities as told by your health care provider. Ask your health care provider what activities are safe for you. Managing cramping and bloating  Try walking around when you have cramps or feel bloated. If directed, apply heat to your abdomen as told by your health care provider. Use the heat source that your health care provider recommends, such as a moist heat pack or a heating pad. Place a towel between your skin and the heat source. Leave the heat on for 20-30 minutes. Remove the heat if your skin turns bright red. This is especially important if you are unable to feel pain, heat, or cold. You have a greater risk of getting burned. General instructions If you were given a sedative during the procedure, it can affect you for several hours. Do not drive or operate machinery until your health care provider says that it is safe. For the first 24 hours after the procedure: Do not sign important documents. Do not drink alcohol. Do your regular daily activities at a slower pace than normal. Eat soft foods that are easy to digest. Take over-the-counter and prescription medicines only as told by your health care provider. Keep all follow-up visits. This is important. Contact  a health care provider if: You have blood in your stool 2-3 days after the procedure. Get help right away if: You have more than a small spotting of blood in your stool. You have large blood clots in your stool. You have swelling of your abdomen. You have nausea or vomiting. You have a fever. You have increasing pain in your abdomen that is not relieved with medicine. These symptoms may be an emergency. Get help right away. Call 911. Do not wait to see if the symptoms will go away. Do not drive yourself to the hospital. Summary After the procedure, it is common to have a small amount of blood in your stool. You may also have mild cramping and bloating of your abdomen. If you were given a sedative during the procedure, it can affect you for several hours. Do not drive or operate machinery until your health care provider says that it is safe. Get help right away if you have a lot of  blood in your stool, nausea or vomiting, a fever, or increased pain in your abdomen. This information is not intended to replace advice given to you by your health care provider. Make sure you discuss any questions you have with your health care provider. Document Revised: 12/18/2020 Document Reviewed: 12/18/2020 Elsevier Patient Education  [place] After This sheet gives you information about how to care for yourself after your procedure. Your health care provider may also give you more specific instructions. If you have problems or questions, contact your health care provider. What can I expect after the procedure? After the procedure, it is common to have: Tiredness. Forgetfulness about what happened after the procedure. Impaired judgment for important decisions. Nausea or vomiting. Some difficulty with balance. Follow these instructions at home: For the time period you were told by your health care provider:     Rest as needed. Do not participate in activities  where you could fall or become injured. Do not drive or use machinery. Do not drink alcohol. Do not take sleeping pills or medicines that cause drowsiness. Do not make important decisions or sign legal documents. Do not take care of children on your own. Eating and drinking Follow the diet that is recommended by your health care provider. Drink enough fluid to keep your urine pale yellow. If you vomit: Drink water, juice, or soup when you can drink without vomiting. Make sure you have little or no nausea before eating solid foods. General instructions Have a responsible adult stay with you for the time you are told. It is important to have someone help care for you until you are awake and alert. Take over-the-counter and prescription medicines only as told by your health care provider. If you have sleep apnea, surgery and certain medicines can increase your risk for breathing problems. Follow instructions from your health care provider about wearing your sleep device: Anytime you are sleeping, including during daytime naps. While taking prescription pain medicines, sleeping medicines, or medicines that make you drowsy. Avoid smoking. Keep all follow-up visits as told by your health care provider. This is important. Contact a health care provider if: You keep feeling nauseous or you keep vomiting. You feel light-headed. You are still sleepy or having trouble with balance after 24 hours. You develop a rash. You have a fever. You have redness or swelling around the IV site. Get help right away if: You have trouble breathing. You have new-onset confusion at home. Summary For several hours after your procedure, you may feel tired. You may also be forgetful and have poor judgment. Have a responsible adult stay with you for the time you are told. It is important to have someone help care for you until you are awake and alert. Rest as told. Do not drive or operate machinery. Do not drink  alcohol or take sleeping pills. Get help right away if you have trouble breathing, or if you suddenly become confused. This information is not intended to replace advice given to you by your health care provider. Make sure you discuss any questions you have with your health care provider. Document Revised: 04/01/2021 Document Reviewed: 03/30/2019 Elsevier Patient Education  [place].

## 2021-11-20 ENCOUNTER — Encounter (HOSPITAL_COMMUNITY)
Admission: RE | Admit: 2021-11-20 | Discharge: 2021-11-20 | Disposition: A | Discharge status: Home / Self Care | Payer: No Typology Code available for payment source | Source: Ambulatory Visit | Attending: Internal Medicine | Admitting: Internal Medicine

## 2021-11-20 ENCOUNTER — Encounter (HOSPITAL_COMMUNITY): Payer: Self-pay

## 2021-11-20 VITALS — BP 134/70 | HR 72 | Temp 97.9°F | Resp 18 | Ht 73.0 in | Wt 244.5 lb

## 2021-11-20 DIAGNOSIS — K7581 Nonalcoholic steatohepatitis (NASH): Secondary | ICD-10-CM | POA: Diagnosis not present

## 2021-11-20 DIAGNOSIS — Z01818 Encounter for other preprocedural examination: Secondary | ICD-10-CM | POA: Insufficient documentation

## 2021-11-20 DIAGNOSIS — I10 Essential (primary) hypertension: Secondary | ICD-10-CM | POA: Diagnosis not present

## 2021-11-20 DIAGNOSIS — K7460 Unspecified cirrhosis of liver: Secondary | ICD-10-CM | POA: Diagnosis not present

## 2021-11-20 HISTORY — DX: Personal history of urinary calculi: Z87.442

## 2021-11-20 LAB — COMPREHENSIVE METABOLIC PANEL
ALT: 22 U/L (ref 0–44)
AST: 25 U/L (ref 15–41)
Albumin: 3.6 g/dL (ref 3.5–5.0)
Alkaline Phosphatase: 70 U/L (ref 38–126)
Anion gap: 4 — ABNORMAL LOW (ref 5–15)
BUN: 23 mg/dL (ref 8–23)
CO2: 23 mmol/L (ref 22–32)
Calcium: 9 mg/dL (ref 8.9–10.3)
Chloride: 108 mmol/L (ref 98–111)
Creatinine, Ser: 1.51 mg/dL — ABNORMAL HIGH (ref 0.61–1.24)
GFR, Estimated: 52 mL/min — ABNORMAL LOW (ref >60)
Glucose, Bld: 316 mg/dL — ABNORMAL HIGH (ref 70–99)
Potassium: 4 mmol/L (ref 3.5–5.1)
Sodium: 135 mmol/L (ref 135–145)
Total Bilirubin: 1.1 mg/dL (ref 0.3–1.2)
Total Protein: 6.8 g/dL (ref 6.5–8.1)

## 2021-11-20 LAB — CBC WITH DIFFERENTIAL/PLATELET
Abs Immature Granulocytes: 0.01 10*3/uL (ref 0.00–0.07)
Basophils Absolute: 0.1 10*3/uL (ref 0.0–0.1)
Basophils Relative: 2 %
Eosinophils Absolute: 0.2 10*3/uL (ref 0.0–0.5)
Eosinophils Relative: 4 %
HCT: 35.8 % — ABNORMAL LOW (ref 39.0–52.0)
Hemoglobin: 11.7 g/dL — ABNORMAL LOW (ref 13.0–17.0)
Immature Granulocytes: 0 %
Lymphocytes Relative: 21 %
Lymphs Abs: 0.8 10*3/uL (ref 0.7–4.0)
MCH: 29.8 pg (ref 26.0–34.0)
MCHC: 32.7 g/dL (ref 30.0–36.0)
MCV: 91.1 fL (ref 80.0–100.0)
Monocytes Absolute: 0.3 10*3/uL (ref 0.1–1.0)
Monocytes Relative: 8 %
Neutro Abs: 2.5 10*3/uL (ref 1.7–7.7)
Neutrophils Relative %: 65 %
Platelets: 49 10*3/uL — ABNORMAL LOW (ref 150–400)
RBC: 3.93 MIL/uL — ABNORMAL LOW (ref 4.22–5.81)
RDW: 14.2 % (ref 11.5–15.5)
WBC: 3.8 10*3/uL — ABNORMAL LOW (ref 4.0–10.5)
nRBC: 0 % (ref 0.0–0.2)

## 2021-11-20 LAB — PROTIME-INR
INR: 1.3 — ABNORMAL HIGH (ref 0.8–1.2)
Prothrombin Time: 15.7 seconds — ABNORMAL HIGH (ref 11.4–15.2)

## 2021-11-24 ENCOUNTER — Ambulatory Visit (HOSPITAL_COMMUNITY)
Admission: RE | Admit: 2021-11-24 | Discharge: 2021-11-24 | Disposition: A | Discharge status: Home / Self Care | Payer: No Typology Code available for payment source | Source: Ambulatory Visit | Attending: Internal Medicine | Admitting: Internal Medicine

## 2021-11-24 ENCOUNTER — Ambulatory Visit (HOSPITAL_COMMUNITY): Payer: No Typology Code available for payment source | Admitting: Anesthesiology

## 2021-11-24 ENCOUNTER — Encounter (HOSPITAL_COMMUNITY): Payer: Self-pay

## 2021-11-24 ENCOUNTER — Other Ambulatory Visit: Payer: Self-pay

## 2021-11-24 ENCOUNTER — Ambulatory Visit (HOSPITAL_BASED_OUTPATIENT_CLINIC_OR_DEPARTMENT_OTHER): Payer: No Typology Code available for payment source | Admitting: Anesthesiology

## 2021-11-24 ENCOUNTER — Encounter (HOSPITAL_COMMUNITY)
Admission: RE | Disposition: A | Discharge status: Home / Self Care | Payer: Self-pay | Source: Ambulatory Visit | Attending: Internal Medicine

## 2021-11-24 DIAGNOSIS — K7460 Unspecified cirrhosis of liver: Secondary | ICD-10-CM | POA: Diagnosis not present

## 2021-11-24 DIAGNOSIS — I8510 Secondary esophageal varices without bleeding: Secondary | ICD-10-CM

## 2021-11-24 DIAGNOSIS — M1990 Unspecified osteoarthritis, unspecified site: Secondary | ICD-10-CM | POA: Diagnosis not present

## 2021-11-24 DIAGNOSIS — K7581 Nonalcoholic steatohepatitis (NASH): Secondary | ICD-10-CM

## 2021-11-24 DIAGNOSIS — Z09 Encounter for follow-up examination after completed treatment for conditions other than malignant neoplasm: Secondary | ICD-10-CM | POA: Diagnosis present

## 2021-11-24 DIAGNOSIS — E875 Hyperkalemia: Secondary | ICD-10-CM

## 2021-11-24 DIAGNOSIS — D61818 Other pancytopenia: Secondary | ICD-10-CM

## 2021-11-24 DIAGNOSIS — K621 Rectal polyp: Secondary | ICD-10-CM

## 2021-11-24 DIAGNOSIS — D128 Benign neoplasm of rectum: Secondary | ICD-10-CM | POA: Insufficient documentation

## 2021-11-24 DIAGNOSIS — Z8619 Personal history of other infectious and parasitic diseases: Secondary | ICD-10-CM

## 2021-11-24 DIAGNOSIS — E538 Deficiency of other specified B group vitamins: Secondary | ICD-10-CM

## 2021-11-24 DIAGNOSIS — R195 Other fecal abnormalities: Secondary | ICD-10-CM

## 2021-11-24 DIAGNOSIS — I8500 Esophageal varices without bleeding: Secondary | ICD-10-CM | POA: Diagnosis not present

## 2021-11-24 DIAGNOSIS — K766 Portal hypertension: Secondary | ICD-10-CM | POA: Diagnosis not present

## 2021-11-24 DIAGNOSIS — K3189 Other diseases of stomach and duodenum: Secondary | ICD-10-CM | POA: Insufficient documentation

## 2021-11-24 DIAGNOSIS — K5730 Diverticulosis of large intestine without perforation or abscess without bleeding: Secondary | ICD-10-CM | POA: Diagnosis not present

## 2021-11-24 DIAGNOSIS — E559 Vitamin D deficiency, unspecified: Secondary | ICD-10-CM

## 2021-11-24 DIAGNOSIS — Z7984 Long term (current) use of oral hypoglycemic drugs: Secondary | ICD-10-CM | POA: Insufficient documentation

## 2021-11-24 DIAGNOSIS — E119 Type 2 diabetes mellitus without complications: Secondary | ICD-10-CM

## 2021-11-24 DIAGNOSIS — Z79899 Other long term (current) drug therapy: Secondary | ICD-10-CM | POA: Diagnosis not present

## 2021-11-24 DIAGNOSIS — D696 Thrombocytopenia, unspecified: Secondary | ICD-10-CM

## 2021-11-24 DIAGNOSIS — I8511 Secondary esophageal varices with bleeding: Secondary | ICD-10-CM

## 2021-11-24 DIAGNOSIS — D120 Benign neoplasm of cecum: Secondary | ICD-10-CM | POA: Insufficient documentation

## 2021-11-24 DIAGNOSIS — I10 Essential (primary) hypertension: Secondary | ICD-10-CM | POA: Diagnosis not present

## 2021-11-24 DIAGNOSIS — Z1211 Encounter for screening for malignant neoplasm of colon: Secondary | ICD-10-CM | POA: Insufficient documentation

## 2021-11-24 DIAGNOSIS — D693 Immune thrombocytopenic purpura: Secondary | ICD-10-CM

## 2021-11-24 DIAGNOSIS — R040 Epistaxis: Secondary | ICD-10-CM

## 2021-11-24 DIAGNOSIS — E785 Hyperlipidemia, unspecified: Secondary | ICD-10-CM

## 2021-11-24 DIAGNOSIS — K648 Other hemorrhoids: Secondary | ICD-10-CM | POA: Insufficient documentation

## 2021-11-24 DIAGNOSIS — N189 Chronic kidney disease, unspecified: Secondary | ICD-10-CM

## 2021-11-24 DIAGNOSIS — K921 Melena: Secondary | ICD-10-CM

## 2021-11-24 DIAGNOSIS — K922 Gastrointestinal hemorrhage, unspecified: Secondary | ICD-10-CM

## 2021-11-24 DIAGNOSIS — R58 Hemorrhage, not elsewhere classified: Secondary | ICD-10-CM

## 2021-11-24 DIAGNOSIS — M109 Gout, unspecified: Secondary | ICD-10-CM

## 2021-11-24 HISTORY — PX: ESOPHAGEAL BANDING: SHX5518

## 2021-11-24 HISTORY — PX: POLYPECTOMY: SHX149

## 2021-11-24 HISTORY — PX: ESOPHAGOGASTRODUODENOSCOPY (EGD) WITH PROPOFOL: SHX5813

## 2021-11-24 HISTORY — PX: COLONOSCOPY WITH PROPOFOL: SHX5780

## 2021-11-24 LAB — GLUCOSE, CAPILLARY: Glucose-Capillary: 169 mg/dL — ABNORMAL HIGH (ref 70–99)

## 2021-11-24 LAB — TYPE AND SCREEN
ABO/RH(D): O POS
Antibody Screen: NEGATIVE

## 2021-11-24 SURGERY — COLONOSCOPY WITH PROPOFOL
Anesthesia: General

## 2021-11-24 MED ORDER — PROPOFOL 500 MG/50ML IV EMUL
INTRAVENOUS | Status: DC | PRN
  Administered 2021-11-24: 125 ug/kg/min via INTRAVENOUS

## 2021-11-24 MED ORDER — LACTATED RINGERS IV SOLN: INTRAVENOUS | Status: DC

## 2021-11-24 MED ORDER — LIDOCAINE HCL (CARDIAC) PF 100 MG/5ML IV SOSY
PREFILLED_SYRINGE | INTRAVENOUS | Status: DC | PRN
  Administered 2021-11-24: 50 mg via INTRAVENOUS

## 2021-11-24 MED ORDER — STERILE WATER FOR IRRIGATION IR SOLN
Status: DC | PRN
  Administered 2021-11-24: 60 mL

## 2021-11-24 MED ORDER — PROPOFOL 10 MG/ML IV BOLUS
INTRAVENOUS | Status: DC | PRN
  Administered 2021-11-24: 30 mg via INTRAVENOUS
  Administered 2021-11-24: 100 mg via INTRAVENOUS

## 2021-11-24 MED ORDER — LACTATED RINGERS IV SOLN: INTRAVENOUS | Status: DC | PRN

## 2021-11-24 NOTE — Op Note (Addendum)
[facility] Patient Name: George Oneill Procedure Date: 11/24/2021 10:28 AM MRN: [id number] Date of Birth: 08/13/1960 Attending MD: Elon Alas. Abbey Chatters DO CSN: [id number] Age: 61 Admit Type: Outpatient Procedure:                Upper GI endoscopy Indications:              Follow-up of esophageal varices Providers:                Elon Alas. Abbey Chatters, DO, Crystal Page, Everardo Pacific, Aram Candela Referring MD:              Medicines:                See the Anesthesia note for documentation of the                            administered medications Complications:            No immediate complications. Estimated Blood Loss:     Estimated blood loss: none. Procedure:                Pre-Anesthesia Assessment:                           - The anesthesia plan was to use monitored                            anesthesia care (MAC).                           After obtaining informed consent, the endoscope was                            passed under direct vision. Throughout the                            procedure, the patient's blood pressure, pulse, and                            oxygen saturations were monitored continuously. The                            GIF-H190 ([id number]) scope was introduced through the                            mouth, and advanced to the second part of duodenum.                            The upper GI endoscopy was accomplished without                            difficulty. The patient tolerated the procedure                            well. Scope In: 10:40:25 AM  Scope Out: 10:47:06 AM Total Procedure Duration: 0 hours 6 minutes 41 seconds  Findings:      One column of grade III varices with no bleeding and no stigmata of       recent bleeding were found in the lower third of the esophagus,. No red       wale signs were present. One band was successfully placed with complete       eradication, resulting in deflation of varices. There  was no bleeding at       the end of the procedure.      Mild portal hypertensive gastropathy was found in the entire examined       stomach.      The duodenal bulb, first portion of the duodenum and second portion of       the duodenum were normal. Impression:               - Grade III esophageal varices with no bleeding and                            no stigmata of recent bleeding. Completely                            eradicated. Banded.                           - Portal hypertensive gastropathy.                           - Normal duodenal bulb, first portion of the                            duodenum and second portion of the duodenum.                           - No specimens collected. Moderate Sedation:      Per Anesthesia Care Recommendation:           - Patient has a contact number available for                            emergencies. The signs and symptoms of potential                            delayed complications were discussed with the                            patient. Return to normal activities tomorrow.                            Written discharge instructions were provided to the                            patient.                           - Soft diet for 3 days.                           -  Use a proton pump inhibitor PO BID.                           - Repeat upper endoscopy in 4 weeks to evaluate the                            response to therapy.                           - Return to GI office in 4 months. Procedure Code(s):        --- Professional ---                           [phone], Esophagogastroduodenoscopy, flexible,                            transoral; with band ligation of esophageal/gastric                            varices Diagnosis Code(s):        --- Professional ---                           I85.00, Esophageal varices without bleeding                           K76.6, Portal hypertension                           K31.89, Other diseases of stomach  and duodenum CPT copyright 2019 American Medical Association. All rights reserved. The codes documented in this report are preliminary and upon coder review may  be revised to meet current compliance requirements. Elon Alas. Abbey Chatters, DO [place] Abbey Chatters, DO 11/24/2021 10:50:34 AM This report has been signed electronically. Number of Addenda: 0

## 2021-11-24 NOTE — Discharge Instructions (Addendum)
EGD Discharge instructions Please read the instructions outlined below and refer to this sheet in the next few weeks. These discharge instructions provide you with general information on caring for yourself after you leave the hospital. Your doctor may also give you specific instructions. While your treatment has been planned according to the most current medical practices available, unavoidable complications occasionally occur. If you have any problems or questions after discharge, please call your doctor. ACTIVITY You may resume your regular activity but move at a slower pace for the next 24 hours.  Take frequent rest periods for the next 24 hours.  Walking will help expel (get rid of) the air and reduce the bloated feeling in your abdomen.  No driving for 24 hours (because of the anesthesia (medicine) used during the test).  You may shower.  Do not sign any important legal documents or operate any machinery for 24 hours (because of the anesthesia used during the test).  NUTRITION Drink plenty of fluids.  You may resume your normal diet.  Begin with a light meal and progress to your normal diet.  Avoid alcoholic beverages for 24 hours or as instructed by your caregiver.  MEDICATIONS You may resume your normal medications unless your caregiver tells you otherwise.  WHAT YOU CAN EXPECT TODAY You may experience abdominal discomfort such as a feeling of fullness or "gas" pains.  FOLLOW-UP Your doctor will discuss the results of your test with you.  SEEK IMMEDIATE MEDICAL ATTENTION IF ANY OF THE FOLLOWING OCCUR: Excessive nausea (feeling sick to your stomach) and/or vomiting.  Severe abdominal pain and distention (swelling).  Trouble swallowing.  Temperature over 101 F (37.8 C).  Rectal bleeding or vomiting of blood.     Colonoscopy Discharge Instructions  Read the instructions outlined below and refer to this sheet in the next few weeks. These discharge instructions provide you with  general information on caring for yourself after you leave the hospital. Your doctor may also give you specific instructions. While your treatment has been planned according to the most current medical practices available, unavoidable complications occasionally occur.   ACTIVITY You may resume your regular activity, but move at a slower pace for the next 24 hours.  Take frequent rest periods for the next 24 hours.  Walking will help get rid of the air and reduce the bloated feeling in your belly (abdomen).  No driving for 24 hours (because of the medicine (anesthesia) used during the test).   Do not sign any important legal documents or operate any machinery for 24 hours (because of the anesthesia used during the test).  NUTRITION Drink plenty of fluids.  You may resume your normal diet as instructed by your doctor.  Begin with a light meal and progress to your normal diet. Heavy or fried foods are harder to digest and may make you feel sick to your stomach (nauseated).  Avoid alcoholic beverages for 24 hours or as instructed.  MEDICATIONS You may resume your normal medications unless your doctor tells you otherwise.  WHAT YOU CAN EXPECT TODAY Some feelings of bloating in the abdomen.  Passage of more gas than usual.  Spotting of blood in your stool or on the toilet paper.  IF YOU HAD POLYPS REMOVED DURING THE COLONOSCOPY: No aspirin products for 7 days or as instructed.  No alcohol for 7 days or as instructed.  Eat a soft diet for the next 24 hours.  FINDING OUT THE RESULTS OF YOUR TEST Not all test results are  available during your visit. If your test results are not back during the visit, make an appointment with your caregiver to find out the results. Do not assume everything is normal if you have not heard from your caregiver or the medical facility. It is important for you to follow up on all of your test results.  SEEK IMMEDIATE MEDICAL ATTENTION IF: You have more than a spotting of  blood in your stool.  Your belly is swollen (abdominal distention).  You are nauseated or vomiting.  You have a temperature over 101.  You have abdominal pain or discomfort that is severe or gets worse throughout the day.   Your upper endoscopy showed the large esophageal varix is still there.  I rebandaged it today.  I would keep your diet soft for the next 2 to 3 days.  You may have some esophageal discomfort.  This should improve.  Continue on pantoprazole twice daily.    Your colonoscopy revealed 2 polyp(s) which I removed successfully.  One was greater than a centimeter in size.  Await pathology results, my office will contact you. I recommend repeating colonoscopy in 3 years for surveillance purposes.   Follow up with GI in 3-4 months.  I hope you have a great rest of your week!  Elon Alas. Abbey Chatters, D.O. Gastroenterology and Hepatology [facility]

## 2021-11-24 NOTE — Anesthesia Postprocedure Evaluation (Signed)
Anesthesia Post Note  Patient: George Oneill  Procedure(s) Performed: COLONOSCOPY WITH PROPOFOL ESOPHAGOGASTRODUODENOSCOPY (EGD) WITH PROPOFOL ESOPHAGEAL BANDING POLYPECTOMY INTESTINAL  Patient location during evaluation: Phase II Anesthesia Type: General Level of consciousness: awake and alert and oriented Pain management: pain level controlled Vital Signs Assessment: post-procedure vital signs reviewed and stable Respiratory status: spontaneous breathing, nonlabored ventilation and respiratory function stable Cardiovascular status: blood pressure returned to baseline and stable Postop Assessment: no apparent nausea or vomiting Anesthetic complications: no   No notable events documented.   Last Vitals:  Vitals:   11/24/21 1030 11/24/21 1110  BP:  (!) 91/51  Pulse: 60 69  Resp: (!) 23 18  Temp:  36.7 C  SpO2: 99% 98%    Last Pain:  Vitals:   11/24/21 1110  TempSrc: Oral  PainSc: 0-No pain                 [place]hai Beitler C Chisa Kushner

## 2021-11-24 NOTE — Anesthesia Preprocedure Evaluation (Signed)
Anesthesia Evaluation  Patient identified by MRN, date of birth, ID band Patient awake    Reviewed: Allergy & Precautions, NPO status , Patient's Chart, lab work & pertinent test results  Airway Mallampati: II  TM Distance: >3 FB Neck ROM: Full    Dental  (+) Dental Advisory Given, Teeth Intact   Pulmonary neg pulmonary ROS,    Pulmonary exam normal breath sounds clear to auscultation       Cardiovascular Exercise Tolerance: Good hypertension, Pt. on medications Normal cardiovascular exam Rhythm:Regular Rate:Normal     Neuro/Psych negative neurological ROS  negative psych ROS   GI/Hepatic negative GI ROS, (+) Cirrhosis   Esophageal Varices    , Hepatitis -  Endo/Other  diabetes, Well Controlled, Type 2, Oral Hypoglycemic Agents  Renal/GU Renal InsufficiencyRenal disease  negative genitourinary   Musculoskeletal  (+) Arthritis , Osteoarthritis,    Abdominal   Peds negative pediatric ROS (+)  Hematology  (+) Blood dyscrasia (thrombocytopenia), anemia ,   Anesthesia Other Findings   Reproductive/Obstetrics negative OB ROS                             Anesthesia Physical  Anesthesia Plan  ASA: 3  Anesthesia Plan: General   Post-op Pain Management: Minimal or no pain anticipated   Induction: Intravenous  PONV Risk Score and Plan:   Airway Management Planned: Nasal Cannula and Natural Airway  Additional Equipment:   Intra-op Plan:   Post-operative Plan: Possible Post-op intubation/ventilation  Informed Consent: I have reviewed the patients History and Physical, chart, labs and discussed the procedure including the risks, benefits and alternatives for the proposed anesthesia with the patient or authorized representative who has indicated his/her understanding and acceptance.     Dental advisory given  Plan Discussed with: CRNA and Surgeon  Anesthesia Plan Comments:          Anesthesia Quick Evaluation

## 2021-11-24 NOTE — Transfer of Care (Signed)
Immediate Anesthesia Transfer of Care Note  Patient: George Oneill  Procedure(s) Performed: COLONOSCOPY WITH PROPOFOL ESOPHAGOGASTRODUODENOSCOPY (EGD) WITH PROPOFOL ESOPHAGEAL BANDING POLYPECTOMY INTESTINAL  Patient Location: Short Stay  Anesthesia Type:General  Level of Consciousness: awake and drowsy  Airway & Oxygen Therapy: Patient Spontanous Breathing  Post-op Assessment: Report given to RN and Post -op Vital signs reviewed and stable  Post vital signs: Reviewed and stable  Last Vitals:  Vitals Value Taken Time  BP    Temp    Pulse    Resp    SpO2      Last Pain:  Vitals:   11/24/21 1110  TempSrc: Oral  PainSc: 0-No pain         Complications: No notable events documented.

## 2021-11-24 NOTE — Op Note (Signed)
[facility] Patient Name: George Oneill Procedure Date: 11/24/2021 10:52 AM MRN: [id number] Date of Birth: 01-26-1961 Attending MD: Elon Alas. Abbey Chatters DO CSN: [id number] Age: 61 Admit Type: Outpatient Procedure:                Colonoscopy Indications:              Screening for colorectal malignant neoplasm Providers:                Elon Alas. Abbey Chatters, DO, Crystal Page, Everardo Pacific, Aram Candela Referring MD:              Medicines:                See the Anesthesia note for documentation of the                            administered medications Complications:            No immediate complications. Estimated Blood Loss:     Estimated blood loss was minimal. Procedure:                Pre-Anesthesia Assessment:                           - The anesthesia plan was to use monitored                            anesthesia care (MAC).                           After obtaining informed consent, the colonoscope                            was passed under direct vision. Throughout the                            procedure, the patient's blood pressure, pulse, and                            oxygen saturations were monitored continuously. The                            [id number]-HQ190L ([id number]) scope was introduced through                            the anus and advanced to the the cecum, identified                            by appendiceal orifice and ileocecal valve. The                            colonoscopy was performed without difficulty. The                            patient tolerated the procedure well. The  quality                            of the bowel preparation was evaluated using the                            BBPS [facility] Bowel Preparation Scale) with scores                            of: Right Colon = 3, Transverse Colon = 3 and Left                            Colon = 3 (entire mucosa seen well with no residual                            staining,  small fragments of stool or opaque                            liquid). The total BBPS score equals 9. Scope In: 10:51:10 AM Scope Out: 11:04:49 AM Scope Withdrawal Time: 0 hours 11 minutes 8 seconds  Total Procedure Duration: 0 hours 13 minutes 39 seconds  Findings:      Non-bleeding internal hemorrhoids were found during endoscopy.      Multiple medium-mouthed diverticula were found in the sigmoid colon.      A 5 mm polyp was found in the cecum. The polyp was sessile. The polyp       was removed with a cold snare. Resection and retrieval were complete.      A 15 mm polyp was found in the rectum. The polyp was sessile. The polyp       was removed with a hot snare. Resection and retrieval were complete. Impression:               - Non-bleeding internal hemorrhoids.                           - Diverticulosis in the sigmoid colon.                           - One 5 mm polyp in the cecum, removed with a cold                            snare. Resected and retrieved.                           - One 15 mm polyp in the rectum, removed with a hot                            snare. Resected and retrieved. Moderate Sedation:      Per Anesthesia Care Recommendation:           - Patient has a contact number available for                            emergencies. The signs and symptoms of potential  delayed complications were discussed with the                            patient. Return to normal activities tomorrow.                            Written discharge instructions were provided to the                            patient.                           - Resume previous diet.                           - Continue present medications.                           - Await pathology results.                           - Repeat colonoscopy in 3 years for surveillance.                           - Return to GI clinic in 4 months. Procedure Code(s):        --- Professional ---                            [phone], Colonoscopy, flexible; with removal of                            tumor(s), polyp(s), or other lesion(s) by snare                            technique Diagnosis Code(s):        --- Professional ---                           Z12.11, Encounter for screening for malignant                            neoplasm of colon                           K64.8, Other hemorrhoids                           K63.5, Polyp of colon                           K62.1, Rectal polyp                           K57.30, Diverticulosis of large intestine without                            perforation or abscess without bleeding CPT copyright 2019 American Medical Association. All rights  reserved. The codes documented in this report are preliminary and upon coder review may  be revised to meet current compliance requirements. Elon Alas. Abbey Chatters, DO [place] Abbey Chatters, DO 11/24/2021 11:06:57 AM This report has been signed electronically. Number of Addenda: 0
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
## 2021-11-25 LAB — BPAM PLATELET PHERESIS
Blood Product Expiration Date: 202307192359
Blood Product Expiration Date: 202307202359
ISSUE DATE / TIME: 202307170938
ISSUE DATE / TIME: 202307171012
Unit Type and Rh: 5100
Unit Type and Rh: 5100

## 2021-11-25 LAB — SURGICAL PATHOLOGY

## 2021-11-25 LAB — PREPARE PLATELET PHERESIS
Unit division: 0
Unit division: 0

## 2021-11-27 ENCOUNTER — Encounter (HOSPITAL_COMMUNITY): Payer: Self-pay | Admitting: Internal Medicine

## 2021-12-23 ENCOUNTER — Encounter: Payer: Self-pay | Admitting: *Deleted

## 2022-02-09 ENCOUNTER — Encounter (INDEPENDENT_AMBULATORY_CARE_PROVIDER_SITE_OTHER): Payer: Self-pay

## 2022-03-11 NOTE — Patient Instructions (Incomplete)
We are scheduling you for an ultrasound of your abdomen. You will be contacted with an appointment time from our office.   I am ordering labs for you to have completed with [facility] (You may take these to your PCP office to ensure the correct ones are drawn and please have the labs faxed to our office).   Continue propranolol 20 mg BID.  Continue pantoprazole 40 mg daily.  I have refilled both of these for you.  Cirrhosis education:  High-protein diet from a primarily plant-based diet. Avoid red meat.  No raw or undercooked meat, seafood, or shellfish. Low-fat/cholesterol/carbohydrate diet. Limit sodium to no more than 2000 mg/day including everything that you eat and drink. Recommend at least 30 minutes of aerobic and resistance exercise 3 days/week. No more than 2000 mg/day of tylenol Avoid NSAIDs  We will get you scheduled for an upper endoscopy to evaluate for any additional varices.  You will receive separate instructions in the mail. In summary you will need to hold your Ozempic for 1 week prior to your procedure, having the procedure on day 8 or after. You also need to hold your metformin the night prior to and the morning of your procedure.  It was a pleasure to see you today. I want to create trusting relationships with patients. If you receive a survey regarding your visit,  I greatly appreciate you taking time to fill this out on paper or through your MyChart. I value your feedback.  Venetia Night, MSN, FNP-BC, AGACNP-BC [facility]
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
## 2022-03-12 ENCOUNTER — Ambulatory Visit (INDEPENDENT_AMBULATORY_CARE_PROVIDER_SITE_OTHER): Payer: No Typology Code available for payment source | Admitting: Gastroenterology

## 2022-03-12 ENCOUNTER — Encounter: Payer: Self-pay | Admitting: Gastroenterology

## 2022-03-12 ENCOUNTER — Telehealth: Payer: Self-pay | Admitting: Gastroenterology

## 2022-03-12 VITALS — BP 131/79 | HR 75 | Temp 98.0°F | Ht 74.0 in | Wt 240.4 lb

## 2022-03-12 DIAGNOSIS — Z86010 Personal history of colonic polyps: Secondary | ICD-10-CM | POA: Diagnosis not present

## 2022-03-12 DIAGNOSIS — K7460 Unspecified cirrhosis of liver: Secondary | ICD-10-CM

## 2022-03-12 DIAGNOSIS — K219 Gastro-esophageal reflux disease without esophagitis: Secondary | ICD-10-CM | POA: Diagnosis not present

## 2022-03-12 MED ORDER — PANTOPRAZOLE SODIUM 40 MG PO TBEC: 40.0000 mg | DELAYED_RELEASE_TABLET | Freq: Two times a day (BID) | ORAL | 3 refills | Status: DC

## 2022-03-12 MED ORDER — PROPRANOLOL HCL 20 MG PO TABS: 20.0000 mg | ORAL_TABLET | Freq: Two times a day (BID) | ORAL | 3 refills | Status: DC

## 2022-03-12 NOTE — Telephone Encounter (Signed)
Please NIC patient for follow up in 6 months.   Venetia Night, MSN, APRN, FNP-BC, AGACNP-BC [facility]

## 2022-03-17 ENCOUNTER — Encounter: Payer: Self-pay | Admitting: *Deleted

## 2022-03-17 ENCOUNTER — Telehealth: Payer: Self-pay | Admitting: *Deleted

## 2022-03-17 NOTE — Telephone Encounter (Signed)
Per [employer], pt has met all his deductible for the year, so EGD is 100% covered.

## 2022-03-20 ENCOUNTER — Ambulatory Visit (HOSPITAL_COMMUNITY)
Admission: RE | Admit: 2022-03-20 | Discharge: 2022-03-20 | Disposition: A | Discharge status: Home / Self Care | Payer: No Typology Code available for payment source | Source: Ambulatory Visit | Attending: Gastroenterology | Admitting: Gastroenterology

## 2022-03-20 DIAGNOSIS — K7460 Unspecified cirrhosis of liver: Secondary | ICD-10-CM | POA: Diagnosis present

## 2022-03-23 ENCOUNTER — Other Ambulatory Visit: Payer: Self-pay | Admitting: *Deleted

## 2022-03-23 DIAGNOSIS — K7460 Unspecified cirrhosis of liver: Secondary | ICD-10-CM

## 2022-04-07 NOTE — Patient Instructions (Signed)
George Oneill  04/07/2022     '@PREFPERIOPPHARMACY'$ @   Your procedure is scheduled on  04/10/2022.   Report to [facility] at  [phone]  A.M.   Call this number if you have problems the morning of surgery:  [phone]  If you experience any cold or flu symptoms such as cough, fever, chills, shortness of breath, etc. between now and your scheduled surgery, please notify us at the above number.   Remember:  Follow the diet instructions given to you by the office.     Take these medicines the morning of surgery with A SIP OF WATER                           protonix, propranolol,     Do not wear jewelry, make-up or nail polish.  Do not wear lotions, powders, or perfumes, or deodorant.  Do not shave 48 hours prior to surgery.  Men may shave face and neck.  Do not bring valuables to the hospital.  [facility] is not responsible for any belongings or valuables.  Contacts, dentures or bridgework may not be worn into surgery.  Leave your suitcase in the car.  After surgery it may be brought to your room.  For patients admitted to the hospital, discharge time will be determined by your treatment team.  Patients discharged the day of surgery will not be allowed to drive home and must have someone with them for 24 hours.    Special instructions:   DO NOT smoke tobacco or vape for 24 hours before your procedure.  Please read over the following fact sheets that you were given. Anesthesia Post-op Instructions and Care and Recovery After Surgery      Upper Endoscopy, Adult, Care After After the procedure, it is common to have a sore throat. It is also common to have: Mild stomach pain or discomfort. Bloating. Nausea. Follow these instructions at home: The instructions below may help you care for yourself at home. Your health care provider may give you more instructions. If you have questions, ask your health care provider. If you were given a sedative during the procedure,  it can affect you for several hours. Do not drive or operate machinery until your health care provider says that it is safe. If you will be going home right after the procedure, plan to have a responsible adult: Take you home from the hospital or clinic. You will not be allowed to drive. Care for you for the time you are told. Follow instructions from your health care provider about what you may eat and drink. Return to your normal activities as told by your health care provider. Ask your health care provider what activities are safe for you. Take over-the-counter and prescription medicines only as told by your health care provider. Contact a health care provider if you: Have a sore throat that lasts longer than one day. Have trouble swallowing. Have a fever. Get help right away if you: Vomit blood or your vomit looks like coffee grounds. Have bloody, black, or tarry stools. Have a very bad sore throat or you cannot swallow. Have difficulty breathing or very bad pain in your chest or abdomen. These symptoms may be an emergency. Get help right away. Call 911. Do not wait to see if the symptoms will go away. Do not drive yourself to the hospital. Summary After the procedure, it is common to have  a sore throat, mild stomach discomfort, bloating, and nausea. If you were given a sedative during the procedure, it can affect you for several hours. Do not drive until your health care provider says that it is safe. Follow instructions from your health care provider about what you may eat and drink. Return to your normal activities as told by your health care provider. This information is not intended to replace advice given to you by your health care provider. Make sure you discuss any questions you have with your health care provider. Document Revised: 08/06/2021 Document Reviewed: 08/06/2021 Elsevier Patient Education  [place] After The following  information offers guidance on how to care for yourself after your procedure. Your health care provider may also give you more specific instructions. If you have problems or questions, contact your health care provider. What can I expect after the procedure? After the procedure, it is common to have: Tiredness. Little or no memory about what happened during or after the procedure. Impaired judgment when it comes to making decisions. Nausea or vomiting. Some trouble with balance. Follow these instructions at home: For the time period you were told by your health care provider:  Rest. Do not participate in activities where you could fall or become injured. Do not drive or use machinery. Do not drink alcohol. Do not take sleeping pills or medicines that cause drowsiness. Do not make important decisions or sign legal documents. Do not take care of children on your own. Medicines Take over-the-counter and prescription medicines only as told by your health care provider. If you were prescribed antibiotics, take them as told by your health care provider. Do not stop using the antibiotic even if you start to feel better. Eating and drinking Follow instructions from your health care provider about what you may eat and drink. Drink enough fluid to keep your urine pale yellow. If you vomit: Drink clear fluids slowly and in small amounts as you are able. Clear fluids include water, ice chips, low-calorie sports drinks, and fruit juice that has water added to it (diluted fruit juice). Eat light and bland foods in small amounts as you are able. These foods include bananas, applesauce, rice, lean meats, toast, and crackers. General instructions  Have a responsible adult stay with you for the time you are told. It is important to have someone help care for you until you are awake and alert. If you have sleep apnea, surgery and some medicines can increase your risk for breathing problems. Follow  instructions from your health care provider about wearing your sleep device: When you are sleeping. This includes during daytime naps. While taking prescription pain medicines, sleeping medicines, or medicines that make you drowsy. Do not use any products that contain nicotine or tobacco. These products include cigarettes, chewing tobacco, and vaping devices, such as e-cigarettes. If you need help quitting, ask your health care provider. Contact a health care provider if: You feel nauseous or vomit every time you eat or drink. You feel light-headed. You are still sleepy or having trouble with balance after 24 hours. You get a rash. You have a fever. You have redness or swelling around the IV site. Get help right away if: You have trouble breathing. You have new confusion after you get home. These symptoms may be an emergency. Get help right away. Call 911. Do not wait to see if the symptoms will go away. Do not drive yourself to the hospital. This information is  not intended to replace advice given to you by your health care provider. Make sure you discuss any questions you have with your health care provider. Document Revised: 09/22/2021 Document Reviewed: 09/22/2021 Elsevier Patient Education  [place].

## 2022-04-08 ENCOUNTER — Encounter (HOSPITAL_COMMUNITY): Payer: Self-pay

## 2022-04-08 ENCOUNTER — Encounter (HOSPITAL_COMMUNITY)
Admission: RE | Admit: 2022-04-08 | Discharge: 2022-04-08 | Disposition: A | Discharge status: Home / Self Care | Payer: No Typology Code available for payment source | Source: Ambulatory Visit | Attending: Internal Medicine | Admitting: Internal Medicine

## 2022-04-08 VITALS — BP 127/76 | HR 66 | Temp 97.8°F | Resp 18 | Ht 74.0 in | Wt 240.3 lb

## 2022-04-08 DIAGNOSIS — K7460 Unspecified cirrhosis of liver: Secondary | ICD-10-CM | POA: Diagnosis not present

## 2022-04-08 DIAGNOSIS — D61818 Other pancytopenia: Secondary | ICD-10-CM | POA: Diagnosis not present

## 2022-04-08 DIAGNOSIS — E119 Type 2 diabetes mellitus without complications: Secondary | ICD-10-CM | POA: Insufficient documentation

## 2022-04-08 DIAGNOSIS — D696 Thrombocytopenia, unspecified: Secondary | ICD-10-CM | POA: Diagnosis not present

## 2022-04-08 DIAGNOSIS — K7581 Nonalcoholic steatohepatitis (NASH): Secondary | ICD-10-CM | POA: Insufficient documentation

## 2022-04-08 DIAGNOSIS — Z01812 Encounter for preprocedural laboratory examination: Secondary | ICD-10-CM | POA: Insufficient documentation

## 2022-04-10 ENCOUNTER — Encounter (HOSPITAL_COMMUNITY)
Admission: RE | Disposition: A | Discharge status: Home / Self Care | Payer: Self-pay | Source: Home / Self Care | Attending: Internal Medicine

## 2022-04-10 ENCOUNTER — Ambulatory Visit (HOSPITAL_BASED_OUTPATIENT_CLINIC_OR_DEPARTMENT_OTHER): Payer: No Typology Code available for payment source | Admitting: Anesthesiology

## 2022-04-10 ENCOUNTER — Ambulatory Visit (HOSPITAL_COMMUNITY): Payer: No Typology Code available for payment source | Admitting: Anesthesiology

## 2022-04-10 ENCOUNTER — Ambulatory Visit (HOSPITAL_COMMUNITY)
Admission: RE | Admit: 2022-04-10 | Discharge: 2022-04-10 | Disposition: A | Discharge status: Home / Self Care | Payer: No Typology Code available for payment source | Attending: Internal Medicine | Admitting: Internal Medicine

## 2022-04-10 DIAGNOSIS — K3189 Other diseases of stomach and duodenum: Secondary | ICD-10-CM

## 2022-04-10 DIAGNOSIS — Z7984 Long term (current) use of oral hypoglycemic drugs: Secondary | ICD-10-CM | POA: Insufficient documentation

## 2022-04-10 DIAGNOSIS — N189 Chronic kidney disease, unspecified: Secondary | ICD-10-CM | POA: Insufficient documentation

## 2022-04-10 DIAGNOSIS — K219 Gastro-esophageal reflux disease without esophagitis: Secondary | ICD-10-CM | POA: Diagnosis not present

## 2022-04-10 DIAGNOSIS — I129 Hypertensive chronic kidney disease with stage 1 through stage 4 chronic kidney disease, or unspecified chronic kidney disease: Secondary | ICD-10-CM | POA: Insufficient documentation

## 2022-04-10 DIAGNOSIS — I10 Essential (primary) hypertension: Secondary | ICD-10-CM

## 2022-04-10 DIAGNOSIS — I8510 Secondary esophageal varices without bleeding: Secondary | ICD-10-CM | POA: Insufficient documentation

## 2022-04-10 DIAGNOSIS — Z79899 Other long term (current) drug therapy: Secondary | ICD-10-CM | POA: Insufficient documentation

## 2022-04-10 DIAGNOSIS — K766 Portal hypertension: Secondary | ICD-10-CM

## 2022-04-10 DIAGNOSIS — Z9049 Acquired absence of other specified parts of digestive tract: Secondary | ICD-10-CM | POA: Diagnosis not present

## 2022-04-10 DIAGNOSIS — K7460 Unspecified cirrhosis of liver: Secondary | ICD-10-CM | POA: Insufficient documentation

## 2022-04-10 DIAGNOSIS — E785 Hyperlipidemia, unspecified: Secondary | ICD-10-CM | POA: Insufficient documentation

## 2022-04-10 DIAGNOSIS — E1122 Type 2 diabetes mellitus with diabetic chronic kidney disease: Secondary | ICD-10-CM | POA: Diagnosis not present

## 2022-04-10 DIAGNOSIS — K7581 Nonalcoholic steatohepatitis (NASH): Secondary | ICD-10-CM | POA: Diagnosis not present

## 2022-04-10 DIAGNOSIS — M109 Gout, unspecified: Secondary | ICD-10-CM | POA: Insufficient documentation

## 2022-04-10 DIAGNOSIS — I8511 Secondary esophageal varices with bleeding: Secondary | ICD-10-CM

## 2022-04-10 HISTORY — PX: ESOPHAGOGASTRODUODENOSCOPY (EGD) WITH PROPOFOL: SHX5813

## 2022-04-10 HISTORY — PX: BIOPSY: SHX5522

## 2022-04-10 LAB — AFP TUMOR MARKER: AFP, Serum, Tumor Marker: <1.8 ng/mL (ref 0.0–8.4)

## 2022-04-10 LAB — GLUCOSE, CAPILLARY: Glucose-Capillary: 129 mg/dL — ABNORMAL HIGH (ref 70–99)

## 2022-04-10 SURGERY — ESOPHAGOGASTRODUODENOSCOPY (EGD) WITH PROPOFOL
Anesthesia: General

## 2022-04-10 MED ORDER — LACTATED RINGERS IV SOLN: INTRAVENOUS | Status: DC

## 2022-04-10 MED ORDER — PROPOFOL 10 MG/ML IV BOLUS
INTRAVENOUS | Status: DC | PRN
  Administered 2022-04-10 (×4): 20 mg via INTRAVENOUS

## 2022-04-10 MED ORDER — PROPOFOL 500 MG/50ML IV EMUL
INTRAVENOUS | Status: DC | PRN
  Administered 2022-04-10: 150 ug/kg/min via INTRAVENOUS

## 2022-04-10 NOTE — Op Note (Signed)
[facility] Patient Name: Arsenio Schnorr Procedure Date: 04/10/2022 8:33 AM MRN: [id number] Date of Birth: Nov 26, 1960 Attending MD: Elon Alas. Abbey Chatters , [place], [id number] CSN: [id number] Age: 61 Admit Type: Outpatient Procedure:                Upper GI endoscopy Indications:              Follow-up of esophageal varices Providers:                Elon Alas. Abbey Chatters, DO, Tammy Vaught, RN, Everardo [place] Referring MD:              Medicines:                See the Anesthesia note for documentation of the                            administered medications Complications:            No immediate complications. Estimated Blood Loss:     Estimated blood loss was minimal. Procedure:                Pre-Anesthesia Assessment:                           - The anesthesia plan was to use monitored                            anesthesia care (MAC).                           After obtaining informed consent, the endoscope was                            passed under direct vision. Throughout the                            procedure, the patient's blood pressure, pulse, and                            oxygen saturations were monitored continuously. The                            [id number]-H190 ([id number]) scope was introduced through the                            mouth, and advanced to the second part of duodenum.                            The upper GI endoscopy was accomplished without                            difficulty. The patient tolerated the procedure                            well. Scope In: 7:86:76  AM Scope Out: 8:51:47 AM Total Procedure Duration: 0 hours 4 minutes 55 seconds  Findings:      Grade I varices with no bleeding and no stigmata of recent bleeding were       found in the lower third of the esophagus. No red wale signs were       present. Scarring from prior treatment was visible.      Moderate portal hypertensive gastropathy was found in the entire        examined stomach.      Mild mucosal variance was found in the second portion of the duodenum.       ?Lymphangiectasias Biopsies were taken with a cold forceps for histology. Impression:               - Grade I esophageal varices with no bleeding and                            no stigmata of recent bleeding.                           - Portal hypertensive gastropathy.                           - Mucosal variant in the duodenum. Biopsied. Moderate Sedation:      Per Anesthesia Care Recommendation:           - Patient has a contact number available for                            emergencies. The signs and symptoms of potential                            delayed complications were discussed with the                            patient. Return to normal activities tomorrow.                            Written discharge instructions were provided to the                            patient.                           - Resume previous diet.                           - Continue present medications.                           - Await pathology results.                           - Repeat upper endoscopy in 1 year for surveillance.                           - Return to GI clinic in 6 months. Procedure Code(s):        ---  Professional ---                           [phone], Esophagogastroduodenoscopy, flexible,                            transoral; with biopsy, single or multiple Diagnosis Code(s):        --- Professional ---                           I85.00, Esophageal varices without bleeding                           K76.6, Portal hypertension                           K31.89, Other diseases of stomach and duodenum CPT copyright 2022 American Medical Association. All rights reserved. The codes documented in this report are preliminary and upon coder review may  be revised to meet current compliance requirements. Elon Alas. Abbey Chatters, DO [place], DO 04/10/2022 8:56:00 AM This report has been  signed electronically. Number of Addenda: 0

## 2022-04-10 NOTE — Anesthesia Preprocedure Evaluation (Addendum)
Anesthesia Evaluation  Patient identified by MRN, date of birth, ID band Patient awake    Reviewed: Allergy & Precautions, H&P , NPO status , Patient's Chart, lab work & pertinent test results, reviewed documented beta blocker date and time   Airway Mallampati: II  TM Distance: >3 FB Neck ROM: Full    Dental no notable dental hx. (+) Dental Advisory Given, Teeth Intact   Pulmonary neg pulmonary ROS   Pulmonary exam normal breath sounds clear to auscultation       Cardiovascular Exercise Tolerance: Good hypertension, Pt. on medications and Pt. on home beta blockers Normal cardiovascular exam Rhythm:Regular Rate:Normal  20-Nov-2021 11:32:15 [facility] ROUTINE RECORD 07/27/1960 (41 yr) Male Caucasian Vent. rate 69 BPM PR interval 124 ms QRS duration 80 ms QT/QTcB 394/422 ms P-R-T axes 37 6 -14 Normal sinus rhythm Minimal voltage criteria for LVH, may be normal variant ( R in aVL ) Nonspecific T wave abnormality Abnormal ECG When compared with ECG of 14-Jan-2011 14:45,new lvh PREVIOUS ECG IS PRESENT Confirmed by Asencion Noble [phone]) on 11/21/2021 6:54:26 AM   Neuro/Psych negative neurological ROS  negative psych ROS   GI/Hepatic ,GERD  Medicated and Controlled,,(+) Cirrhosis   Esophageal Varices    , Hepatitis -  Endo/Other  diabetes, Well Controlled, Type 2, Oral Hypoglycemic Agents    Renal/GU Renal InsufficiencyRenal disease  negative genitourinary   Musculoskeletal  (+) Arthritis , Osteoarthritis,    Abdominal   Peds negative pediatric ROS (+)  Hematology  (+) Blood dyscrasia, anemia   Anesthesia Other Findings   Reproductive/Obstetrics negative OB ROS                             Anesthesia Physical Anesthesia Plan  ASA: 3  Anesthesia Plan: General   Post-op Pain Management: Minimal or no pain anticipated   Induction: Intravenous  PONV Risk Score and  Plan: Propofol infusion  Airway Management Planned: Nasal Cannula and Natural Airway  Additional Equipment:   Intra-op Plan:   Post-operative Plan: Possible Post-op intubation/ventilation  Informed Consent: I have reviewed the patients History and Physical, chart, labs and discussed the procedure including the risks, benefits and alternatives for the proposed anesthesia with the patient or authorized representative who has indicated his/her understanding and acceptance.     Dental advisory given  Plan Discussed with: CRNA and Surgeon  Anesthesia Plan Comments:         Anesthesia Quick Evaluation

## 2022-04-10 NOTE — Discharge Instructions (Signed)
EGD Discharge instructions Please read the instructions outlined below and refer to this sheet in the next few weeks. These discharge instructions provide you with general information on caring for yourself after you leave the hospital. Your doctor may also give you specific instructions. While your treatment has been planned according to the most current medical practices available, unavoidable complications occasionally occur. If you have any problems or questions after discharge, please call your doctor. ACTIVITY You may resume your regular activity but move at a slower pace for the next 24 hours.  Take frequent rest periods for the next 24 hours.  Walking will help expel (get rid of) the air and reduce the bloated feeling in your abdomen.  No driving for 24 hours (because of the anesthesia (medicine) used during the test).  You may shower.  Do not sign any important legal documents or operate any machinery for 24 hours (because of the anesthesia used during the test).  NUTRITION Drink plenty of fluids.  You may resume your normal diet.  Begin with a light meal and progress to your normal diet.  Avoid alcoholic beverages for 24 hours or as instructed by your caregiver.  MEDICATIONS You may resume your normal medications unless your caregiver tells you otherwise.  WHAT YOU CAN EXPECT TODAY You may experience abdominal discomfort such as a feeling of fullness or "gas" pains.  FOLLOW-UP Your doctor will discuss the results of your test with you.  SEEK IMMEDIATE MEDICAL ATTENTION IF ANY OF THE FOLLOWING OCCUR: Excessive nausea (feeling sick to your stomach) and/or vomiting.  Severe abdominal pain and distention (swelling).  Trouble swallowing.  Temperature over 101 F (37.8 C).  Rectal bleeding or vomiting of blood.   Esophageal varices look much improved.  I did not have to band them again today.  Continue on propranolol.  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  I did take samples.  We will call with these results.  Repeat EGD in 1 year.  Follow-up with GI in 6 months.   I hope you have a great rest of your week!  Elon Alas. Abbey Chatters, D.O. Gastroenterology and Hepatology [facility]

## 2022-04-10 NOTE — Anesthesia Postprocedure Evaluation (Signed)
Anesthesia Post Note  Patient: George Oneill  Procedure(s) Performed: ESOPHAGOGASTRODUODENOSCOPY (EGD) WITH PROPOFOL BIOPSY  Patient location during evaluation: Phase II Anesthesia Type: General Level of consciousness: awake and alert and oriented Pain management: pain level controlled Vital Signs Assessment: post-procedure vital signs reviewed and stable Respiratory status: spontaneous breathing, nonlabored ventilation and respiratory function stable Cardiovascular status: blood pressure returned to baseline and stable Postop Assessment: no apparent nausea or vomiting Anesthetic complications: no  No notable events documented.   Last Vitals:  Vitals:   04/10/22 0857  BP: (!) 101/54  Pulse: 87  Resp: 16  Temp: 36.6 C  SpO2: 97%    Last Pain:  Vitals:   04/10/22 0857  TempSrc: Oral  PainSc: 0-No pain                 Jamarie Joplin C Sneha Willig

## 2022-04-10 NOTE — Transfer of Care (Signed)
Immediate Anesthesia Transfer of Care Note  Patient: George Oneill  Procedure(s) Performed: ESOPHAGOGASTRODUODENOSCOPY (EGD) WITH PROPOFOL BIOPSY  Patient Location: PACU and Short Stay  Anesthesia Type:General  Level of Consciousness: awake, alert , oriented, and patient cooperative  Airway & Oxygen Therapy: Patient Spontanous Breathing  Post-op Assessment: Report given to RN, Post -op Vital signs reviewed and stable, and Patient moving all extremities X 4  Post vital signs: Reviewed and stable  Last Vitals:  Vitals Value Taken Time  BP    Temp 36.6 C 04/10/22 0857  Pulse 87 04/10/22 0857  Resp 16 04/10/22 0857  SpO2 97 % 04/10/22 0857    Last Pain:  Vitals:   04/10/22 0857  TempSrc: Oral  PainSc: 0-No pain      Patients Stated Pain Goal: 5 (48/54/62 7035)  Complications: No notable events documented.

## 2022-04-10 NOTE — Interval H&P Note (Signed)
History and Physical Interval Note:  04/10/2022 8:12 AM  George Oneill  has presented today for surgery, with the diagnosis of GERD,cirrhosis,esophageal varices.  The various methods of treatment have been discussed with the patient and family. After consideration of risks, benefits and other options for treatment, the patient has consented to  Procedure(s) with comments: ESOPHAGOGASTRODUODENOSCOPY (EGD) WITH PROPOFOL (N/A) - 8:45 AM as a surgical intervention.  The patient's history has been reviewed, patient examined, no change in status, stable for surgery.  I have reviewed the patient's chart and labs.  Questions were answered to the patient's satisfaction.     Eloise Harman

## 2022-04-13 LAB — SURGICAL PATHOLOGY

## 2022-04-17 ENCOUNTER — Encounter (HOSPITAL_COMMUNITY): Payer: Self-pay | Admitting: Internal Medicine

## 2022-06-15 ENCOUNTER — Telehealth: Payer: Self-pay | Admitting: *Deleted

## 2022-06-15 NOTE — Telephone Encounter (Signed)
Received letter for approval for 1 Pantoprazole daily.

## 2022-08-05 ENCOUNTER — Telehealth: Payer: Self-pay | Admitting: *Deleted

## 2022-08-05 NOTE — Telephone Encounter (Signed)
Received approval letter from now till 08/03/2023. Sent copy to scan center.

## 2022-08-13 ENCOUNTER — Encounter: Payer: Self-pay | Admitting: Internal Medicine

## 2022-09-01 ENCOUNTER — Other Ambulatory Visit: Payer: Self-pay | Admitting: *Deleted

## 2022-09-01 DIAGNOSIS — K7460 Unspecified cirrhosis of liver: Secondary | ICD-10-CM

## 2022-09-07 ENCOUNTER — Other Ambulatory Visit: Payer: Self-pay | Admitting: *Deleted

## 2022-09-07 ENCOUNTER — Telehealth: Payer: Self-pay | Admitting: *Deleted

## 2022-09-07 DIAGNOSIS — K7460 Unspecified cirrhosis of liver: Secondary | ICD-10-CM

## 2022-09-07 NOTE — Telephone Encounter (Signed)
Pt came by office and says got letter saying time for repeat US.  Advised pt that [place] is scheduled for 09/22/22, arrive at 8:15 am, nothing to eat or drink after midnight. He verbalized understanding.

## 2022-09-22 ENCOUNTER — Ambulatory Visit (HOSPITAL_COMMUNITY)
Admission: RE | Admit: 2022-09-22 | Discharge: 2022-09-22 | Disposition: A | Discharge status: Home / Self Care | Payer: No Typology Code available for payment source | Source: Ambulatory Visit | Attending: Gastroenterology | Admitting: Gastroenterology

## 2022-09-22 DIAGNOSIS — K7460 Unspecified cirrhosis of liver: Secondary | ICD-10-CM | POA: Insufficient documentation

## 2022-09-23 LAB — COMPREHENSIVE METABOLIC PANEL
ALT: 44 IU/L (ref 0–44)
AST: 53 IU/L — ABNORMAL HIGH (ref 0–40)
Albumin/Globulin Ratio: 1.8 (ref 1.2–2.2)
Albumin: 4.2 g/dL (ref 3.9–4.9)
Alkaline Phosphatase: 85 IU/L (ref 44–121)
BUN/Creatinine Ratio: 14 (ref 10–24)
BUN: 19 mg/dL (ref 8–27)
Bilirubin Total: 1.1 mg/dL (ref 0.0–1.2)
CO2: 25 mmol/L (ref 20–29)
Calcium: 9.2 mg/dL (ref 8.6–10.2)
Chloride: 105 mmol/L (ref 96–106)
Creatinine, Ser: 1.36 mg/dL — ABNORMAL HIGH (ref 0.76–1.27)
Globulin, Total: 2.3 g/dL (ref 1.5–4.5)
Glucose: 132 mg/dL — ABNORMAL HIGH (ref 70–99)
Potassium: 4.5 mmol/L (ref 3.5–5.2)
Sodium: 142 mmol/L (ref 134–144)
Total Protein: 6.5 g/dL (ref 6.0–8.5)
eGFR: 58 mL/min/{1.73_m2} — ABNORMAL LOW (ref >59)

## 2022-09-23 LAB — AFP TUMOR MARKER: AFP, Serum, Tumor Marker: <1.8 ng/mL (ref 0.0–8.4)

## 2022-09-23 LAB — CBC WITH DIFFERENTIAL/PLATELET
Basophils Absolute: 0 10*3/uL (ref 0.0–0.2)
Basos: 1 %
EOS (ABSOLUTE): 0.1 10*3/uL (ref 0.0–0.4)
Eos: 3 %
Hematocrit: 40.7 % (ref 37.5–51.0)
Hemoglobin: 13.6 g/dL (ref 13.0–17.7)
Immature Grans (Abs): 0 10*3/uL (ref 0.0–0.1)
Immature Granulocytes: 0 %
Lymphocytes Absolute: 1 10*3/uL (ref 0.7–3.1)
Lymphs: 26 %
MCH: 30.9 pg (ref 26.6–33.0)
MCHC: 33.4 g/dL (ref 31.5–35.7)
MCV: 93 fL (ref 79–97)
Monocytes Absolute: 0.3 10*3/uL (ref 0.1–0.9)
Monocytes: 7 %
Neutrophils Absolute: 2.4 10*3/uL (ref 1.4–7.0)
Neutrophils: 63 %
Platelets: 42 10*3/uL — CL (ref 150–450)
RBC: 4.4 x10E6/uL (ref 4.14–5.80)
RDW: 15 % (ref 11.6–15.4)
WBC: 3.7 10*3/uL (ref 3.4–10.8)

## 2022-09-23 LAB — PROTIME-INR
INR: 1.2 (ref 0.9–1.2)
Prothrombin Time: 12.6 s — ABNORMAL HIGH (ref 9.1–12.0)

## 2022-10-08 ENCOUNTER — Encounter: Payer: Self-pay | Admitting: Gastroenterology

## 2022-10-08 ENCOUNTER — Ambulatory Visit (INDEPENDENT_AMBULATORY_CARE_PROVIDER_SITE_OTHER): Payer: No Typology Code available for payment source | Admitting: Gastroenterology

## 2022-10-08 VITALS — BP 113/69 | HR 68 | Temp 97.7°F | Ht 73.0 in | Wt 211.2 lb

## 2022-10-08 DIAGNOSIS — Z86010 Personal history of colonic polyps: Secondary | ICD-10-CM

## 2022-10-08 DIAGNOSIS — K219 Gastro-esophageal reflux disease without esophagitis: Secondary | ICD-10-CM

## 2022-10-08 DIAGNOSIS — K7460 Unspecified cirrhosis of liver: Secondary | ICD-10-CM

## 2022-10-08 DIAGNOSIS — R053 Chronic cough: Secondary | ICD-10-CM | POA: Diagnosis not present

## 2022-10-08 MED ORDER — SUCRALFATE 1 GM/10ML PO SUSP: 1.0000 g | Freq: Three times a day (TID) | ORAL | 0 refills | Status: DC

## 2022-10-08 NOTE — Patient Instructions (Addendum)
Continue propranolol 20 mg twice daily.  Cirrhosis Lifestyle Recommendations:  High-protein diet from a primarily plant-based diet. Avoid red meat.  No raw or undercooked meat, seafood, or shellfish. Low-fat/cholesterol/carbohydrate diet. Limit sodium to no more than 2000 mg/day including everything that you eat and drink. Recommend at least 30 minutes of aerobic and resistance exercise 3 days/week. Limit Tylenol to 2000 mg daily.   Will plan for liver labs and ultrasound in 6 months.  We will send you a letter again just at this time we will plan to see you in the office in 6 months for this.  You will be due for surveillance upper endoscopy in December.   Continue pantoprazole 40 mg twice daily, 30 minutes prior to breakfast and dinner.  Continue famotidine 40 mg nightly.  Will trial short course of Carafate to help soothe the upper esophagus in hopes to allow your upper airway to heal.  You should take this about 15 minutes before meals.  I am sending in suspension, if there is an issue with cost please let me know and I will send in the tablets for you to dissolve and then drink like a slurry.  I would recommend that you try sleeping propped up to avoid any mild regurgitation of acid from sitting in your throat at night.  You may continue to use cough drops and over-the-counter cough syrup as needed.   Please fill out a records request upfront to obtain your ENT records.  As we discussed I do agree with ENT that if you have no improvement with the Carafate in the next few weeks until you see ENT and I agree with pulmonology referral.   Follow up in 2 months. Please let me know via mychart message how you are doing in 3 weeks.   It was a pleasure to see you today. I want to create trusting relationships with patients. If you receive a survey regarding your visit,  I greatly appreciate you taking time to fill this out on paper or through your MyChart. I value your feedback.  Brooke Bonito,  MSN, FNP-BC, AGACNP-BC [facility]
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
## 2022-11-03 ENCOUNTER — Other Ambulatory Visit: Payer: Self-pay | Admitting: Gastroenterology
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
## 2022-12-09 ENCOUNTER — Ambulatory Visit (INDEPENDENT_AMBULATORY_CARE_PROVIDER_SITE_OTHER): Payer: No Typology Code available for payment source | Admitting: Gastroenterology

## 2022-12-09 ENCOUNTER — Ambulatory Visit: Payer: No Typology Code available for payment source | Admitting: Gastroenterology

## 2022-12-09 ENCOUNTER — Encounter: Payer: Self-pay | Admitting: Gastroenterology

## 2022-12-09 VITALS — BP 133/79 | HR 73 | Temp 98.6°F | Ht 73.0 in | Wt 209.4 lb

## 2022-12-09 DIAGNOSIS — K7460 Unspecified cirrhosis of liver: Secondary | ICD-10-CM | POA: Diagnosis not present

## 2022-12-09 DIAGNOSIS — K219 Gastro-esophageal reflux disease without esophagitis: Secondary | ICD-10-CM

## 2022-12-09 DIAGNOSIS — R053 Chronic cough: Secondary | ICD-10-CM | POA: Diagnosis not present

## 2022-12-09 NOTE — Patient Instructions (Addendum)
Continue taking pantoprazole 40 mg twice daily and famotidine 40 mg nightly.  For now since you have not seen any improvement with Singulair (montekulast) and Zyrtec you can stop these for now unless Dr. Sherene Sires recommends continuing them.  Continue taking your Astelin nasal spray.  Please let me know if you would like to use Carafate as needed for severe coughing symptoms as I am happy to rewrite another prescription for you.  Please keep me updated after you see pulmonology.  We will follow-up in 8 weeks to see if you have had any improvement and discuss whether or not we need to change pantoprazole to something different based off of Dr. Thurston Hole evaluation and findings.  We will still plan for labs and repeat ultrasound for your liver in November.  It was a pleasure to see you today. I want to create trusting relationships with patients. If you receive a survey regarding your visit,  I greatly appreciate you taking time to fill this out on paper or through your MyChart. I value your feedback.  Brooke Bonito, MSN, FNP-BC, AGACNP-BC [facility]

## 2022-12-15 NOTE — Progress Notes (Unsigned)
George Oneill, male    DOB: [birth year]    MRN: [id number]   Brief patient profile:  24  yowm never smoker  no resp problems  referred to pulmonary clinic in [place]  12/16/2022 by ENT in [place]/GI doctor in [place]  for cough onset Dec 2023    GI entry  10/08/22  Went to [facility] ENT and seen a PA there regarding his cough.    Cough has been going on for 3-6 months. Wife noticed it more after that last endoscopy in December. Having throat clearing and spit up of clear phlegm and and a cough. At times this is a violent cough at night and will throw up. Feels like he has drainage down his throat. When he eats is when he feels it going down. Does not have to blow his nose. ENT stopped his lisinopril about a month ago. Ran camera down his nose and saw a lot of inflammation around voice box.    Taking famotidine nightly and pantoprazole twice daily. Cough is worse with eating. Denies dysphagia. Family doctor gave him an antibiotics and prednisone and that helped and then went back and he was given a steroid injection and told to take Flonase. States flonase did not help at all. ENT told him to start zyrtec and he states that isn't helping. Lisinopril d/c initially helped but it came back and wife states it may be slightly worse than prior. Living on cough drops and that helps shortly.  Has also taken Delsym at home.  Has f/u with ENT end of month   History of Present Illness  12/16/2022  Pulmonary/ 1st office eval/  / [facility] on ppi at bfast/ p supper along with pepcid  Chief Complaint  Patient presents with   Establish Care   Cough  Dyspnea:  slightly  worse ex tol since onset of cough  Cough: wakes up each am marble sized thick mucus but ent eval neg for pnds and only really productive of thick or excess mucus in am x first few min up  Rest of day min mucoid not thick/ coughs so much gag/vomits p supper about once a week Talking also triggers cough  Sleep: flat bed/ one pillow  immediately starts coughing / using a lot of cough drop. SABA use: none  02: none   No obvious day to day or daytime pattern/variability or assoc purulent sputum or mucus plugs or hemoptysis or cp or chest tightness, subjective wheeze or overt sinus or hb symptoms.     Also denies any obvious fluctuation of symptoms with weather or environmental changes or other aggravating or alleviating factors except as outlined above   No unusual exposure hx or h/o childhood pna/ asthma or knowledge of premature birth.  Current Allergies, Complete Past Medical History, Past Surgical History, Family History, and Social History were reviewed in [employer] record.  ROS  The following are not active complaints unless bolded Hoarseness, sore throat, dysphagia, dental problems, itching, sneezing,  nasal congestion and sensation of discharge of excess mucus or purulent secretions, ear ache,   fever, chills, sweats, unintended wt loss or wt gain, classically pleuritic or exertional cp,  orthopnea pnd or arm/hand swelling  or leg swelling, presyncope, palpitations, abdominal pain, anorexia, nausea, vomiting, diarrhea  or change in bowel habits or change in bladder habits, change in stools or change in urine, dysuria, hematuria,  rash, arthralgias, visual complaints, headache, numbness, weakness or ataxia or problems with walking or coordination,  change in mood or  memory.            Outpatient Medications Prior to Visit  Medication Sig Dispense Refill   allopurinol (ZYLOPRIM) 300 MG tablet Take 300 mg by mouth daily.       cetirizine (ZYRTEC) 10 MG chewable tablet Chew 10 mg by mouth daily.     famotidine (PEPCID) 40 MG tablet Take 40 mg by mouth daily.     losartan (COZAAR) 25 MG tablet Take 25 mg by mouth daily.     metFORMIN (GLUCOPHAGE-XR) 500 MG 24 hr tablet Take 500 mg by mouth 2 (two) times daily.     OZEMPIC, 0.25 OR 0.5 MG/DOSE, 2 MG/3ML SOPN Inject 0.5 mg into the skin every  Wednesday.     pantoprazole (PROTONIX) 40 MG tablet Take 1 tablet (40 mg total) by mouth 2 (two) times daily. 180 tablet 3   propranolol (INDERAL) 20 MG tablet Take 1 tablet (20 mg total) by mouth 2 (two) times daily. 180 tablet 3   traZODone (DESYREL) 100 MG tablet Take 100 mg by mouth at bedtime as needed for sleep.     azelastine (ASTELIN) 0.1 % nasal spray Place into both nostrils 2 (two) times daily. Use in each nostril as directed (Patient not taking: Reported on 12/16/2022)     montelukast (SINGULAIR) 10 MG tablet  (Patient not taking: Reported on 12/16/2022)     No facility-administered medications prior to visit.    Past Medical History:  Diagnosis Date   Arthritis    Chronic kidney disease    hx of kidney stones   Chronic thrombocytopenia    Diabetes mellitus    Epistaxis    Excessive bleeding    Gout    History of kidney stones    History of staph infection    Hyperlipidemia    Hypertension    NASH (nonalcoholic steatohepatitis)    Pancytopenia (HCC)    Vitamin B12 deficiency    Vitamin D deficiency       Objective:     BP 124/69   Pulse 75   Ht 6\' 1"  (1.854 m)   Wt 209 lb (94.8 kg)   SpO2 94%   BMI 27.57 kg/m   SpO2: 94 % ra  Pleasant amb wm nad freq throat clearing    HEENT : Oropharynx  pristine     Nasal turbinates nl   NECK :  without  apparent JVD/ palpable Nodes/TM    LUNGS: no acc muscle use,  Nl contour chest which is clear to A and P bilaterally with cough on insp   maneuvers   CV:  RRR  no s3 or murmur or increase in P2, and no edema   ABD:  soft and nontender with nl inspiratory excursion in the supine position. No bruits or organomegaly appreciated   MS:  Nl gait/ ext warm without deformities Or obvious joint restrictions  calf tenderness, cyanosis or clubbing    SKIN: warm and dry without lesions    NEURO:  alert, approp, nl sensorium with  no motor or cerebellar deficits apparent.       I personally reviewed images and agree  with radiology impression as follows:  CXR:   pa and lateral 12/23/22 wnl   Assessment   Upper airway cough syndrome Onset p egd 04/2012  - Try off inderal and losartan 12/16/2022 and suppress with mucinex dm/ tessalon 200 / hard rock candy plus pred x 6 d  Upper airway cough syndrome (  previously labeled PNDS),  is so named because it's frequently impossible to sort out how much is  CR/sinusitis with freq throat clearing (which can be related to primary GERD)   vs  causing  secondary (" extra esophageal")  GERD from wide swings in gastric pressure that occur with throat clearing, often  promoting self use of mint and menthol lozenges that reduce the lower esophageal sphincter tone and exacerbate the problem further in a cyclical fashion.   These are the same pts (now being labeled as having "irritable larynx syndrome" by some cough centers) who not infrequently have a history of having failed to tolerate ace inhibitors,  dry powder inhalers or biphosphonates or report having atypical/extraesophageal reflux symptoms that don't respond to standard doses of PPI (gag and vomit would count here) and are easily confused as having aecopd or asthma flares by even experienced allergists/ pulmonologists (myself included).  Of the three most common causes of  Sub-acute / recurrent or chronic cough, only one (GERD)  can actually contribute to/ trigger  the other two (asthma and post nasal drip syndrome)  and perpetuate the cylce of cough.  While not intuitively obvious, many patients with chronic low grade reflux do not cough until there is a primary insult  (like a plastic tube for egd or intubation ) that disturbs the protective epithelial barrier / exposing  sensitive nerve endings or PNDS which is pt's perception not shared by ENT leading to  throat clearing and more epithelial trauma.    >>>   The point is that once this occurs, it is difficult to eliminate the cycle  using anything but a maximally effective  acid suppression regimen at least in the short run, accompanied by an appropriate diet to address non acid GERD and control / eliminate the cough itself with mucinex dm 1200 bid plus prn tessalon 200 >>> also  added 6 day taper off  Prednisone starting at 40 mg per day in case of component of Th-2 driven upper or lower airways inflammation (if cough responds short term only to relapse before return while will on full rx for uacs (as above), then  that would point to allergic rhinitis/ asthma or eos bronchitis as alternative dx)   Advised: The standardized cough guidelines published in Chest by [place] in 2006 are still the best available and consist of a multiple step process (up to 12!) , not a single office visit,  and are intended  to address this problem logically,  with an alogrithm dependent on response to empiric treatment at  each progressive step  to determine a specific diagnosis with  minimal addtional testing needed. Therefore if adherence is an issue or can't be accurately verified,  it's very unlikely the standard evaluation and treatment will be successful here.    Furthermore, response to therapy (other than acute cough suppression, which should only be used short term with avoidance of narcotic containing cough syrups if possible), can be a gradual process for which the patient is not likely to  perceive immediate benefit.  Unlike going to an eye doctor where the best perscription is almost always the first one and is immediately effective, this is almost never the case in the management of chronic cough syndromes. Therefore the patient needs to commit up front to consistently adhere to recommendations  for up to 6 weeks of therapy directed at the likely underlying problem(s) before the response can be reasonably evaluated.   F/u 2 weeks with all meds  in hand using a trust but verify approach to confirm accurate Medication  Reconciliation The principal here is that until we are  certain that the  patients are doing what we've asked, it makes no sense to ask them to do more.   Essential hypertension D/c inderol / losartan due to cough  and replace with bisoprolol 5 mg up to bid  For reasons that may related to vascular permability and nitric oxide pathways but not elevated  bradykinin levels (as seen with  ACEi use) losartan in the generic form has been reported now from mulitple sources  to cause a similar pattern of non-specific  upper airway symptoms as seen with acei.   This has not been reported with exposure to the other ARB's to date, so it seems reasonable for now to try either generic diovan or avapro if ARB needed or use an alternative class altogether (bisoprolol ) See:  Dew[facility]atch Allergy Asthma Immunol  2008: 101: p [phone]    In the setting of respiratory symptoms of unknown etiology,  It would be preferable to use bystolic, the most beta -1  selective Beta blocker available in sample form, with bisoprolol the most selective generic choice  on the market, at least on a trial basis, to make sure the spillover Beta 2 effects of the less specific Beta blockers are not contributing to this patient's symptoms.   >>> try d/c both and replace with bisoprolol 5 mg up to bid          Each maintenance medication was reviewed in detail including emphasizing most importantly the difference between maintenance and prns and under what circumstances the prns are to be triggered using an action plan format where appropriate.  Total time for H and P, chart review, counseling, and generating customized AVS unique to this office visit / same day charting = 65 min new pt eval          Sandrea Hughs, MD 12/16/2022

## 2022-12-16 ENCOUNTER — Ambulatory Visit (HOSPITAL_COMMUNITY)
Admission: RE | Admit: 2022-12-16 | Discharge: 2022-12-16 | Disposition: A | Discharge status: Home / Self Care | Payer: No Typology Code available for payment source | Source: Ambulatory Visit | Attending: Internal Medicine | Admitting: Internal Medicine

## 2022-12-16 ENCOUNTER — Ambulatory Visit: Payer: No Typology Code available for payment source | Admitting: Internal Medicine

## 2022-12-16 ENCOUNTER — Encounter: Payer: Self-pay | Admitting: Internal Medicine

## 2022-12-16 VITALS — BP 124/69 | HR 75 | Ht 73.0 in | Wt 209.0 lb

## 2022-12-16 DIAGNOSIS — I10 Essential (primary) hypertension: Secondary | ICD-10-CM

## 2022-12-16 DIAGNOSIS — R058 Other specified cough: Secondary | ICD-10-CM | POA: Insufficient documentation

## 2022-12-16 MED ORDER — PREDNISONE 10 MG PO TABS: ORAL_TABLET | ORAL | 0 refills | Status: DC

## 2022-12-16 MED ORDER — BISOPROLOL FUMARATE 5 MG PO TABS: 5.0000 mg | ORAL_TABLET | Freq: Every day | ORAL | 11 refills | Status: DC

## 2022-12-16 NOTE — Patient Instructions (Addendum)
Stop propanolol 20mg  losartan  and replace with bisoprolol 5 mg once daily  - ok to take twice daily if blood pressure top pressure is over 120   Mucinex DM 1200 mg twice daily (not as needed)   Best cough medication is tessalon (benzoate) 200 mg every 4 hours as needed  GERD (REFLUX)  is an extremely common cause of respiratory symptoms just like yours , many times with no obvious heartburn at all.    It can be treated with medication, but also with lifestyle changes including elevation of the head of your bed (ideally with 6-8inch blocks under the headboard of your bed),  Smoking cessation, avoidance of late meals, excessive alcohol, and avoid fatty foods, chocolate, peppermint, colas, red wine, and acidic juices such as orange juice.  NO MINT OR MENTHOL PRODUCTS SO NO COUGH DROPS  USE SUGARLESS CANDY INSTEAD (Jolley ranchers or Stover's or Life Savers) or even ice chips will also do - the key is to swallow to prevent all throat clearing. NO OIL BASED VITAMINS - use powdered substitutes.  Avoid fish oil when coughing.   Prednisone 10 mg take  4 each am x 2 days,   2 each am x 2 days,  1 each am x 2 days and stop   Pantoprazole 40 mg  Take 30- 60 min before your first and last meals of the day and pepcid 20 mg before bedtime    Please remember to go to the  x-ray department  @  [facility] for your tests - we will call you with the results when they are available      Please schedule a follow up office visit in 2 weeks, sooner if needed  with all medications /inhalers/ solutions in hand so we can verify exactly what you are taking. This includes all medications from all doctors and over the counters

## 2022-12-16 NOTE — Assessment & Plan Note (Addendum)
Onset p egd 04/2012  - Try off inderal and losartan 12/16/2022 and suppress with mucinex dm/ tessalon 200 / hard rock candy plus pred x 6 d  Upper airway cough syndrome (previously labeled PNDS),  is so named because it's frequently impossible to sort out how much is  CR/sinusitis with freq throat clearing (which can be related to primary GERD)   vs  causing  secondary (" extra esophageal")  GERD from wide swings in gastric pressure that occur with throat clearing, often  promoting self use of mint and menthol lozenges that reduce the lower esophageal sphincter tone and exacerbate the problem further in a cyclical fashion.   These are the same pts (now being labeled as having "irritable larynx syndrome" by some cough centers) who not infrequently have a history of having failed to tolerate ace inhibitors,  dry powder inhalers or biphosphonates or report having atypical/extraesophageal reflux symptoms that don't respond to standard doses of PPI (gag and vomit would count here) and are easily confused as having aecopd or asthma flares by even experienced allergists/ pulmonologists (myself included).  Of the three most common causes of  Sub-acute / recurrent or chronic cough, only one (GERD)  can actually contribute to/ trigger  the other two (asthma and post nasal drip syndrome)  and perpetuate the cylce of cough.  While not intuitively obvious, many patients with chronic low grade reflux do not cough until there is a primary insult  (like a plastic tube for egd or intubation ) that disturbs the protective epithelial barrier / exposing  sensitive nerve endings or PNDS which is pt's perception not shared by ENT leading to  throat clearing and more epithelial trauma.    >>>   The point is that once this occurs, it is difficult to eliminate the cycle  using anything but a maximally effective acid suppression regimen at least in the short run, accompanied by an appropriate diet to address non acid GERD and  control / eliminate the cough itself with mucinex dm 1200 bid plus prn tessalon 200 >>> also  added 6 day taper off  Prednisone starting at 40 mg per day in case of component of Th-2 driven upper or lower airways inflammation (if cough responds short term only to relapse before return while will on full rx for uacs (as above), then  that would point to allergic rhinitis/ asthma or eos bronchitis as alternative dx)   Advised: The standardized cough guidelines published in Chest by [place] in 2006 are still the best available and consist of a multiple step process (up to 12!) , not a single office visit,  and are intended  to address this problem logically,  with an alogrithm dependent on response to empiric treatment at  each progressive step  to determine a specific diagnosis with  minimal addtional testing needed. Therefore if adherence is an issue or can't be accurately verified,  it's very unlikely the standard evaluation and treatment will be successful here.    Furthermore, response to therapy (other than acute cough suppression, which should only be used short term with avoidance of narcotic containing cough syrups if possible), can be a gradual process for which the patient is not likely to  perceive immediate benefit.  Unlike going to an eye doctor where the best perscription is almost always the first one and is immediately effective, this is almost never the case in the management of chronic cough syndromes. Therefore the patient needs to commit up front to  consistently adhere to recommendations  for up to 6 weeks of therapy directed at the likely underlying problem(s) before the response can be reasonably evaluated.   F/u 2 weeks with all meds in hand using a trust but verify approach to confirm accurate Medication  Reconciliation The principal here is that until we are certain that the  patients are doing what we've asked, it makes no sense to ask them to do more.

## 2022-12-16 NOTE — Assessment & Plan Note (Addendum)
D/c inderol / losartan due to cough  and replace with bisoprolol 5 mg up to bid  For reasons that may related to vascular permability and nitric oxide pathways but not elevated  bradykinin levels (as seen with  ACEi use) losartan in the generic form has been reported now from mulitple sources  to cause a similar pattern of non-specific  upper airway symptoms as seen with acei.   This has not been reported with exposure to the other ARB's to date, so it seems reasonable for now to try either generic diovan or avapro if ARB needed or use an alternative class altogether (bisoprolol ) See:  Dewayne Hatch Allergy Asthma Immunol  2008: 101: p [phone]    In the setting of respiratory symptoms of unknown etiology,  It would be preferable to use bystolic, the most beta -1  selective Beta blocker available in sample form, with bisoprolol the most selective generic choice  on the market, at least on a trial basis, to make sure the spillover Beta 2 effects of the less specific Beta blockers are not contributing to this patient's symptoms.   >>> try d/c both and replace with bisoprolol 5 mg up to bid          Each maintenance medication was reviewed in detail including emphasizing most importantly the difference between maintenance and prns and under what circumstances the prns are to be triggered using an action plan format where appropriate.  Total time for H and P, chart review, counseling, and generating customized AVS unique to this office visit / same day charting = 65 min new pt eval

## 2022-12-21 ENCOUNTER — Encounter: Payer: Self-pay | Admitting: Internal Medicine

## 2022-12-23 MED ORDER — BENZONATATE 200 MG PO CAPS: 200.0000 mg | ORAL_CAPSULE | Freq: Three times a day (TID) | ORAL | 2 refills | Status: DC | PRN

## 2022-12-23 NOTE — Telephone Encounter (Signed)
Please advise on rx request, thank you

## 2022-12-23 NOTE — Telephone Encounter (Signed)
Rx sent to pharmacy.  Message to patient with note from MW.

## 2022-12-30 NOTE — Progress Notes (Signed)
ELIEZER MALENA, male    DOB: 17-Dec-1960    MRN: [id number]   Brief patient profile:  23  yowm never smoker  no resp problems  referred to pulmonary clinic in [place]  12/16/2022 by ENT in [place]/GI doctor in [place]  for cough onset Dec 2023    GI entry  10/08/22  Went to [facility] ENT and seen a PA there regarding his cough.    Cough has been going on for 3-6 months. Wife noticed it more after that last endoscopy in December for varices. Having throat clearing and spit up of clear phlegm and and a cough. At times this is a violent cough at night and will throw up. Feels like he has drainage down his throat. When he eats is when he feels it going down. Does not have to blow his nose. ENT stopped his lisinopril about a month ago. Ran camera down his nose and saw a lot of inflammation around voice box.    Taking famotidine nightly and pantoprazole twice daily. Cough is worse with eating. Denies dysphagia. Family doctor gave him an antibiotics and prednisone and that helped and then went back and he was given a steroid injection and told to take Flonase. States flonase did not help at all. ENT told him to start zyrtec and he states that isn't helping. Lisinopril d/c initially helped but it came back and wife states it may be slightly worse than prior. Living on cough drops and that helps shortly.  Has also taken Delsym at home.  Has f/u with ENT end of month.   History of Present Illness  12/16/2022  Pulmonary/ 1st office eval/ [facility] / [place] [facility] on ppi at bfast/ p supper along with pepcid  Chief Complaint  Patient presents with   Establish Care   Cough  Dyspnea:  slightly  worse ex tol since onset of cough  Cough: wakes up each am marble sized thick mucus but ent eval neg for pnds and only really productive of thick or excess mucus in am x first few min up  Rest of day min mucoid not thick/ coughs so much gag/vomits p supper about once a week Talking also triggers cough  Sleep: flat  bed/ one pillow immediately starts coughing / using a lot of cough drop. SABA use: none  02: none  Rec Stop propanolol 20mg  losartan  and replace with bisoprolol 5 mg once daily  - ok to take twice daily if blood pressure top pressure is over 120  Mucinex DM 1200 mg twice daily (not as needed)  Best cough medication is tessalon (benzoate) 200 mg every 4 hours as needed GERD diet reviewed, bed blocks rec  Prednisone 10 mg take  4 each am x 2 days,   2 each am x 2 days,  1 each am x 2 days and stop  Pantoprazole 40 mg  Take 30- 60 min before your first and last meals of the day and pepcid 20 mg before bedtime   Please schedule a follow up office visit in 2 weeks, sooner if needed  with all medications /inhalers/ solutions in hand so we can verify exactly what you are taking. This includes all medications from all doctors and over the counters    12/31/2022  f/u ov/ office/Berenis Corter re: UACS vs AB maint on mucinex dm / tessalon 200 mg in am and hs - thinks bisoprolol makes him more tired than propranolol  Dyspnea: [employer] nl pace made him sob  Cough:  occ thick white < 1/2 tsp in am only otherwise dry hack with throat cleairng all day  Sleeping: once asleep no resp cc  SABA use: none  02: none     No obvious day to day or daytime variability or assoc excess/ purulent sputum or mucus plugs or hemoptysis or cp or chest tightness, subjective wheeze or overt sinus or hb symptoms.    Also denies any obvious fluctuation of symptoms with weather or environmental changes or other aggravating or alleviating factors except as outlined above   No unusual exposure hx or h/o childhood pna/ asthma or knowledge of premature birth.  Current Allergies, Complete Past Medical History, Past Surgical History, Family History, and Social History were reviewed in [employer] record.  ROS  The following are not active complaints unless bolded Hoarseness, sore throat, dysphagia, dental  problems, itching, sneezing,  nasal congestion or discharge of excess mucus or purulent secretions, ear ache,   fever, chills, sweats, unintended wt loss or wt gain, classically pleuritic or exertional cp,  orthopnea pnd or arm/hand swelling  or leg swelling, presyncope, palpitations, abdominal pain, anorexia, nausea, vomiting, diarrhea  or change in bowel habits or change in bladder habits, change in stools or change in urine, dysuria, hematuria,  rash, arthralgias, visual complaints, headache, numbness, weakness or ataxia or problems with walking or coordination,  change in mood or  memory.        Current Meds  Medication Sig   allopurinol (ZYLOPRIM) 300 MG tablet Take 300 mg by mouth daily.     benzonatate (TESSALON) 200 MG capsule Take 1 capsule (200 mg total) by mouth 3 (three) times daily as needed for cough.   famotidine (PEPCID) 40 MG tablet Take 40 mg by mouth daily.        metFORMIN (GLUCOPHAGE-XR) 500 MG 24 hr tablet Take 500 mg by mouth 2 (two) times daily.   OZEMPIC, 0.25 OR 0.5 MG/DOSE, 2 MG/3ML SOPN Inject 0.5 mg into the skin every Wednesday.   pantoprazole (PROTONIX) 40 MG tablet Take 1 tablet (40 mg total) by mouth 2 (two) times daily.   traZODone (DESYREL) 100 MG tablet Take 100 mg by mouth at bedtime as needed for sleep.   [ bisoprolol (ZEBETA) 5 MG tablet Take 1 tablet (5 mg total) by mouth daily.           Past Medical History:  Diagnosis Date   Arthritis    Chronic kidney disease    hx of kidney stones   Chronic thrombocytopenia    Diabetes mellitus    Epistaxis    Excessive bleeding    Gout    History of kidney stones    History of staph infection    Hyperlipidemia    Hypertension    NASH (nonalcoholic steatohepatitis)    Pancytopenia (HCC)    Vitamin B12 deficiency    Vitamin D deficiency       Objective:     Wt Readings from Last 3 Encounters:  12/31/22 207 lb (93.9 kg)  12/16/22 209 lb (94.8 kg)  12/09/22 209 lb 6.4 oz (95 kg)      Vital  signs reviewed  12/31/2022  - Note at rest 02 sats  95% on RA   General appearance:    amb wm pristine oropharynx freq throat clearing      HEENT     Nasal turbinates nl    NECK :  without  apparent JVD/ palpable Nodes/TM    LUNGS: no  acc muscle use,  Nl contour chest which is clear to A and P bilaterally with  cough on insp maneuvers   CV:  RRR  no s3 or murmur or increase in P2, and no edema   ABD:  soft and nontender with nl inspiratory excursion in the supine position. No bruits or organomegaly appreciated   MS:  Nl gait/ ext warm without deformities Or obvious joint restrictions  calf tenderness, cyanosis or clubbing    SKIN: warm and dry without lesions    NEURO:  alert, approp, nl sensorium with  no motor or cerebellar deficits apparent.     Assessment    Upper airway cough syndrome Assessment & Plan: Onset p egd 04/2012  - Try off inderal and losartan 12/16/2022 and suppress with mucinex dm/ tessalon 200 / hard rock candy plus pred x 6 d > no better 12/31/2022  - gabapentin 100 mg bid titrate max 100 qid and f/u in 4 weeks   Of the three most common causes of  Sub-acute / recurrent or chronic cough, only one (GERD)  can actually contribute to/ trigger  the other two (asthma and post nasal drip syndrome)  and perpetuate the cylce of cough.  While not intuitively obvious, many patients with chronic low grade reflux do not cough until there is a primary insult that disturbs the protective epithelial barrier and exposes sensitive nerve endings.   This is typically viral but can due to PNDS and  either may apply here.     >>>The point is that once this occurs, it is difficult to eliminate the cycle  using anything but a maximally effective acid suppression regimen at least in the short run, accompanied by an appropriate diet to address non acid GERD and control / eliminate the cough itself with gabapentin titrated as high as 1200 mg per day or the lowest effective dose, whichever  occurs first.  >>> also added 1st gen H1 blockers per guidelines  to eliminate pnds when can afford to be sleepy    Essential hypertension Assessment & Plan: D/c inderol / losartan due to cough  and replace with bisoprolol 5 mg up to bid - changed back to propranolol 12/31/2022 as says bisoprolol made him sleepy  I would still avoid losartan here in favor of valsartan based on anecdotal reports of the generic version of the former causing cough but defer this to PCP going forward.         Each maintenance medication was reviewed in detail including emphasizing most importantly the difference between maintenance and prns and under what circumstances the prns are to be triggered using an action plan format where appropriate.  Total time for H and P, chart review, counseling,  and generating customized AVS unique to this office visit / same day charting > 30 min for  refractory respiratory  symptoms of uncertain etiology         Other orders -     Gabapentin; Take 1 capsule (100 mg total) by mouth 4 (four) times daily.  Dispense: 120 capsule; Refill: 2     Sandrea Hughs, MD Pulmonary and Critical Care Medicine [facility]

## 2022-12-31 ENCOUNTER — Ambulatory Visit (INDEPENDENT_AMBULATORY_CARE_PROVIDER_SITE_OTHER): Payer: No Typology Code available for payment source | Admitting: Internal Medicine

## 2022-12-31 ENCOUNTER — Encounter: Payer: Self-pay | Admitting: Internal Medicine

## 2022-12-31 VITALS — BP 125/70 | HR 64 | Ht 73.0 in | Wt 207.0 lb

## 2022-12-31 DIAGNOSIS — I10 Essential (primary) hypertension: Secondary | ICD-10-CM

## 2022-12-31 DIAGNOSIS — R058 Other specified cough: Secondary | ICD-10-CM | POA: Diagnosis not present

## 2022-12-31 MED ORDER — GABAPENTIN 100 MG PO CAPS: 100.0000 mg | ORAL_CAPSULE | Freq: Four times a day (QID) | ORAL | 2 refills | Status: DC

## 2022-12-31 NOTE — Patient Instructions (Addendum)
Stop bisoprolol and restart propranolol   Mucinex DM 1200 mg every 12 hours until you are not coughing any more and use  benzonate 200 up to every 4-6 hours as needed to stop even the throat clearing as needed   Gabapentin 100 mg twice daily x one week then three daily for a week then four times daily thereafter  For drainage / throat tickle try take CHLORPHENIRAMINE  4 mg  ("Allergy Relief" 4mg   at [facility] should be easiest to find in the blue box usually on bottom shelf)  take one every 4 hours as needed - extremely effective and inexpensive over the counter- may cause drowsiness so start with just a dose or two an hour before bedtime and see how you tolerate it before trying in daytime.   Please schedule a follow up office visit in 4 weeks, sooner if needed  with all respiratory medications /inhalers/ solutions in hand so we can verify exactly what you are taking. This includes all medications from all doctors and over the counters

## 2023-01-01 NOTE — Assessment & Plan Note (Addendum)
Onset p egd 04/2012  - Try off inderal and losartan 12/16/2022 and suppress with mucinex dm/ tessalon 200 / hard rock candy plus pred x 6 d > no better 12/31/2022  - gabapentin 100 mg bid titrate max 100 qid and f/u in 4 weeks   Of the three most common causes of  Sub-acute / recurrent or chronic cough, only one (GERD)  can actually contribute to/ trigger  the other two (asthma and post nasal drip syndrome)  and perpetuate the cylce of cough.  While not intuitively obvious, many patients with chronic low grade reflux do not cough until there is a primary insult that disturbs the protective epithelial barrier and exposes sensitive nerve endings.   This is typically viral but can due to PNDS and  either may apply here.     >>>The point is that once this occurs, it is difficult to eliminate the cycle  using anything but a maximally effective acid suppression regimen at least in the short run, accompanied by an appropriate diet to address non acid GERD and control / eliminate the cough itself with gabapentin titrated as high as 1200 mg per day or the lowest effective dose, whichever occurs first.  >>> also added 1st gen H1 blockers per guidelines  to eliminate pnds when can afford to be sleepy

## 2023-01-01 NOTE — Assessment & Plan Note (Signed)
D/c inderol / losartan due to cough  and replace with bisoprolol 5 mg up to bid - changed back to propranolol 12/31/2022 as says bisoprolol made him sleepy  I would still avoid losartan here in favor of valsartan based on anecdotal reports of the generic version of the former causing cough but defer this to PCP going forward.         Each maintenance medication was reviewed in detail including emphasizing most importantly the difference between maintenance and prns and under what circumstances the prns are to be triggered using an action plan format where appropriate.  Total time for H and P, chart review, counseling,  and generating customized AVS unique to this office visit / same day charting > 30 min for  refractory respiratory  symptoms of uncertain etiology

## 2023-02-03 ENCOUNTER — Ambulatory Visit: Payer: No Typology Code available for payment source | Admitting: Internal Medicine

## 2023-02-03 NOTE — Progress Notes (Unsigned)
George Oneill, male    DOB: 10-Jan-1961    MRN: [id number]   Brief patient profile:  58  yowm never smoker  no resp problems  referred to pulmonary clinic in [place]  12/16/2022 by ENT in [place]/GI doctor in [place]  for cough onset Dec 2023    GI noted 10/08/22 Went to [facility] ENT and seen a PA there regarding his cough.    Cough has been going on for 3-6 months. Wife noticed it more after that last endoscopy in December for varices. Having throat clearing and spit up of clear phlegm and and a cough. At times this is a violent cough at night and will throw up. Feels like he has drainage down his throat. When he eats is when he feels it going down. Does not have to blow his nose. ENT stopped his lisinopril about a month prior to OV  . Ran camera down his nose and saw a lot of inflammation around voice box.    Taking famotidine nightly and pantoprazole twice daily. Cough is worse with eating. Denies dysphagia. Family doctor gave him an antibiotics and prednisone and that helped and then went back and he was given a steroid injection and told to take Flonase. States flonase did not help at all. ENT told him to start zyrtec and he states that isn't helping. Lisinopril d/c initially helped but it came back and wife states it may be slightly worse than prior. Living on cough drops and that helps shortly.  Has also taken Delsym at home.  Has f/u with ENT end of month.   History of Present Illness  12/16/2022  Pulmonary/ 1st office eval/ George Oneill / [place] [facility] on ppi at bfast/ p supper along with pepcid  Chief Complaint  Patient presents with   Establish Care   Cough  Dyspnea:  slightly  worse ex tol since onset of cough  Cough: wakes up each am marble sized thick mucus but ent eval neg for pnds and only really productive of thick or excess mucus in am x first few min up  Rest of day min mucoid not thick/ coughs so much gag/vomits p supper about once a week Talking also triggers cough  Sleep:  flat bed/ one pillow immediately starts coughing / using a lot of cough drop. SABA use: none  02: none  Rec Stop propanolol 20mg  losartan  and replace with bisoprolol 5 mg once daily  - ok to take twice daily if blood pressure top pressure is over 120  Mucinex DM 1200 mg twice daily (not as needed)  Best cough medication is tessalon (benzoate) 200 mg every 4 hours as needed GERD diet reviewed, bed blocks rec  Prednisone 10 mg take  4 each am x 2 days,   2 each am x 2 days,  1 each am x 2 days and stop  Pantoprazole 40 mg  Take 30- 60 min before your first and last meals of the day and pepcid 20 mg before bedtime   Please schedule a follow up office visit in 2 weeks, sooner if needed  with all medications /inhalers/ solutions in hand so we can verify exactly what you are taking. This includes all medications from all doctors and over the counters    12/31/2022  f/u ov/[place] office/George Oneill re: UACS vs AB maint on mucinex dm / tessalon 200 mg in am and hs - thinks bisoprolol makes him more tired than propranolol  Dyspnea: [employer] nl pace made him sob  Cough: occ thick white < 1/2 tsp in am only otherwise dry hack with throat cleairng all day  Sleeping: once asleep no resp cc  SABA use: none  02: none   Rec Stop bisoprolol and restart propranolol  Mucinex DM 1200 mg every 12 hours until you are not coughing any more and use  benzonate 200 up to every 4-6 hours as needed to stop even the throat clearing as needed  Gabapentin 100 mg twice daily x one week then three daily for a week then four times daily thereafter For drainage / throat tickle try take CHLORPHENIRAMINE  4 mg   Please schedule a follow up office visit in 4 weeks, sooner if needed  with all respiratory medications /inhalers/ solutions in hand   02/04/2023  f/u ov/[place] office/George Oneill re: cough x dec 2023   ab vs uacs  maint on gabapentin 100 mg qid  "not better" but never got  1st gen H1 blockers per guidelines   Chief Complaint   Patient presents with   Cough    4 follow up    Dyspnea:   Not limited by breathing from desired activities   Cough: mostly daytime / stringy white min even p uses netti pot nothing purulent  Sleeping: flat bed s resp cc noct SABA [id number]  02: none   No obvious day to day or daytime variability or assoc excess/ purulent sputum or mucus plugs or hemoptysis or cp or chest tightness, subjective wheeze or overt sinus or hb symptoms.    Also denies any obvious fluctuation of symptoms with weather or environmental changes or other aggravating or alleviating factors except as outlined above   No unusual exposure hx or h/o childhood pna/ asthma or knowledge of premature birth.  Current Allergies, Complete Past Medical History, Past Surgical History, Family History, and Social History were reviewed in [employer] record.  ROS  The following are not active complaints unless bolded Hoarseness, sore throat/globus, dysphagia, dental problems, itching, sneezing,  nasal congestion or sensation of discharge of excess mucus or purulent secretions, ear ache,   fever, chills, sweats, unintended wt loss or wt gain, classically pleuritic or exertional cp,  orthopnea pnd or arm/hand swelling  or leg swelling, presyncope, palpitations, abdominal pain, anorexia, nausea, vomiting, diarrhea  or change in bowel habits or change in bladder habits, change in stools or change in urine, dysuria, hematuria,  rash, arthralgias, visual complaints, headache, numbness, weakness or ataxia or problems with walking or coordination,  change in mood or  memory.        Current Meds  Medication Sig   allopurinol (ZYLOPRIM) 300 MG tablet Take 300 mg by mouth daily.     famotidine (PEPCID) 40 MG tablet Take 40 mg by mouth daily.   gabapentin (NEURONTIN) 100 MG capsule Take 1 capsule (100 mg total) by mouth 4 (four) times daily.   guaiFENesin (MUCINEX) 600 MG 12 hr tablet Take by mouth 2 (two) times daily.    metFORMIN (GLUCOPHAGE-XR) 500 MG 24 hr tablet Take 500 mg by mouth 2 (two) times daily.   OZEMPIC, 0.25 OR 0.5 MG/DOSE, 2 MG/3ML SOPN Inject 0.5 mg into the skin every Wednesday.   pantoprazole (PROTONIX) 40 MG tablet Take 1 tablet (40 mg total) by mouth 2 (two) times daily.   traZODone (DESYREL) 100 MG tablet Take 100 mg by mouth at bedtime as needed for sleep.            Past Medical History:  Diagnosis  Date   Arthritis    Chronic kidney disease    hx of kidney stones   Chronic thrombocytopenia    Diabetes mellitus    Epistaxis    Excessive bleeding    Gout    History of kidney stones    History of staph infection    Hyperlipidemia    Hypertension    NASH (nonalcoholic steatohepatitis)    Pancytopenia (HCC)    Vitamin B12 deficiency    Vitamin D deficiency       Objective:     02/04/2023       210   12/31/22 207 lb (93.9 kg)  12/16/22 209 lb (94.8 kg)  12/09/22 209 lb 6.4 oz (95 kg)     Vital signs reviewed  02/04/2023  - Note at rest 02 sats  94% on RA   General appearance:   amb somber wm / no tessalon this am/ freq  throat clearing despite nl oropharynx and  despite hard rock candy  in mouth    HEENT : Oropharynx  clear, no pnd/ no cobblestoing      Nasal turbinates nl    NECK :  without  apparent JVD/ palpable Nodes/TM    LUNGS: no acc muscle use,  Nl contour chest which is clear to A and P bilaterally with cough early on insp  maneuvers sometime almost gagging quality    CV:  RRR  no s3 or murmur or increase in P2, and no edema   ABD:  soft and nontender with nl inspiratory excursion in the supine position. No bruits or organomegaly appreciated   MS:  Nl gait/ ext warm without deformities Or obvious joint restrictions  calf tenderness, cyanosis or clubbing    SKIN: warm and dry without lesions    NEURO:  alert, approp, nl sensorium with  no motor or cerebellar deficits apparent.              Assessment

## 2023-02-04 ENCOUNTER — Encounter: Payer: Self-pay | Admitting: Internal Medicine

## 2023-02-04 ENCOUNTER — Ambulatory Visit: Payer: No Typology Code available for payment source | Admitting: Internal Medicine

## 2023-02-04 VITALS — BP 123/69 | HR 74 | Ht 73.0 in | Wt 210.0 lb

## 2023-02-04 DIAGNOSIS — R058 Other specified cough: Secondary | ICD-10-CM | POA: Diagnosis not present

## 2023-02-04 MED ORDER — GABAPENTIN 300 MG PO CAPS: 300.0000 mg | ORAL_CAPSULE | Freq: Four times a day (QID) | ORAL | 2 refills | Status: DC

## 2023-02-04 MED ORDER — BENZONATATE 200 MG PO CAPS: 200.0000 mg | ORAL_CAPSULE | Freq: Three times a day (TID) | ORAL | 1 refills | Status: DC | PRN

## 2023-02-04 NOTE — Assessment & Plan Note (Addendum)
Onset p egd 04/2012  - Try off inderal and losartan 12/16/2022 and suppress with mucinex dm/ tessalon 200 / hard rock candy plus pred x 6 d > no better 12/31/2022  - gabapentin 100 mg bid titrate max 100 qid  and 1st gen H1 blockers per guidelines  and f/u in 4 weeks > did not get 1st gen H1 blockers per guidelines   - 02/04/2023 titrate up gabapentin to 300 mg qid and start 1st gen H1 blockers per guidelines    Of the three most common causes of  Sub-acute / recurrent or chronic cough, only one (GERD)  can actually contribute to/ trigger  the other two (asthma and post nasal drip syndrome)  and perpetuate the cylce of cough.  While not intuitively obvious, many patients with chronic low grade reflux do not cough until there is a primary insult that disturbs the protective epithelial barrier and exposes sensitive nerve endings.   This is typically viral but can due to PNDS and  either may apply here.   The point is that once this occurs, it is difficult to eliminate the cycle  using anything but a maximally effective acid suppression regimen at least in the short run, accompanied by an appropriate diet to address non acid GERD and control / eliminate all pnd with 1st gen H1 blockers per guidelines  and titrate up gabapentin to max of 300 mg qid   Discussed in detail all the  indications, usual  risks and alternatives  relative to the benefits with patient who agrees to proceed with Rx as outlined.      F/u 4 weeks with all meds in hand using a trust but verify approach to confirm accurate Medication  Reconciliation The principal here is that until we are certain that the  patients are doing what we've asked, it makes no sense to ask them to do more.          Each maintenance medication was reviewed in detail including emphasizing most importantly the difference between maintenance and prns and under what circumstances the prns are to be triggered using an action plan format where appropriate.  Total  time for H and P, chart review, counseling,  and generating customized AVS unique to this office visit / same day charting = > 40 min for    refractory respiratory  symptoms of uncertain etiology

## 2023-02-04 NOTE — Patient Instructions (Addendum)
For drainage / throat tickle try take CHLORPHENIRAMINE  4 mg  ("Allergy Relief" 4mg   at [facility] should be easiest to find in the blue box usually on bottom shelf)  take one every 4 hours as needed - extremely effective and inexpensive over the counter- may cause drowsiness so start with just a dose or two an hour before bedtime and see how you tolerate it before trying in daytime.   Increase gabapentin 300mg  twice daily for a week then three times daily for a week and then 4 x daily.  Ok to continue the tessalon up to every 4 hours   Continue famotidine take right after supper daily   Please schedule a follow up office visit in 4 weeks, sooner if needed  with all medications /inhalers/ solutions in hand so we can verify exactly what you are taking. This includes all medications from all doctors and over the counters
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
## 2023-02-11 ENCOUNTER — Ambulatory Visit: Payer: No Typology Code available for payment source | Admitting: Gastroenterology

## 2023-02-11 ENCOUNTER — Encounter: Payer: Self-pay | Admitting: Gastroenterology

## 2023-02-11 VITALS — BP 136/75 | HR 88 | Temp 97.8°F | Ht 73.0 in | Wt 213.2 lb

## 2023-02-11 DIAGNOSIS — K7581 Nonalcoholic steatohepatitis (NASH): Secondary | ICD-10-CM

## 2023-02-11 DIAGNOSIS — R053 Chronic cough: Secondary | ICD-10-CM | POA: Diagnosis not present

## 2023-02-11 DIAGNOSIS — K219 Gastro-esophageal reflux disease without esophagitis: Secondary | ICD-10-CM

## 2023-02-11 DIAGNOSIS — K7460 Unspecified cirrhosis of liver: Secondary | ICD-10-CM

## 2023-02-11 MED ORDER — PANTOPRAZOLE SODIUM 40 MG PO TBEC: 40.0000 mg | DELAYED_RELEASE_TABLET | Freq: Two times a day (BID) | ORAL | 3 refills | Status: DC

## 2023-02-11 NOTE — Patient Instructions (Addendum)
Continue pantoprazole 40 mg BID before meals and famotidine nightly.   Continue to follow with Dr. Sherene Sires  We will reach out to schedule your RUQ [place] for November. We will mail you your lab slips closer to time to have them done.  Cirrhosis Lifestyle Recommendations:  High-protein diet from a primarily plant-based diet. Avoid red meat.  No raw or undercooked meat, seafood, or shellfish. Low-fat/cholesterol/carbohydrate diet. Limit sodium to no more than 2000 mg/day including everything that you eat and drink. Recommend at least 30 minutes of aerobic and resistance exercise 3 days/week. Limit Tylenol to 2000 mg daily.   Follow up in 4 months.   It was a pleasure to see you today. I want to create trusting relationships with patients. If you receive a survey regarding your visit,  I greatly appreciate you taking time to fill this out on paper or through your MyChart. I value your feedback.  Brooke Bonito, MSN, FNP-BC, AGACNP-BC [facility]

## 2023-02-18 ENCOUNTER — Encounter: Payer: Self-pay | Admitting: Internal Medicine

## 2023-03-03 NOTE — Progress Notes (Signed)
George Oneill, male    DOB: [birth year]    MRN: [id number]   Brief patient profile:  76  yowm never smoker  referred to pulmonary clinic in [place]  12/16/2022 by ENT in [place]/GI doctor in [place]  for cough onset Dec 2023    GI noted 10/08/22 Went to [facility] ENT and saw   PA there regarding his cough.    Cough x 3-6 months. Wife noticed it more after that last endoscopy in December for varices. Having throat clearing and spit up of clear phlegm and and a cough. At times this is a violent cough at night and will throw up. Feels like he has drainage down his throat. When he eats is when he feels it going down. Does not have to blow his nose. ENT stopped his lisinopril about a month prior to OV  . Ran camera down his nose and saw a lot of inflammation around voice box.    Taking famotidine nightly and pantoprazole twice daily. Cough is worse with eating. Denies dysphagia. Family doctor gave him an antibiotics and prednisone and that helped and then went back and he was given a steroid injection and told to take Flonase. States flonase did not help at all. ENT told him to start zyrtec and he states that isn't helping. Lisinopril d/c initially helped but it came back and wife states it may be slightly worse than prior. Living on cough drops and that helps shortly.  Has also taken Delsym at home.    Allergy testing by ent pos only for mold   History of Present Illness  12/16/2022  Pulmonary/ 1st office eval/ George Oneill / [place] [facility] on ppi at bfast/ p supper along with pepcid  Chief Complaint  Patient presents with   Establish Care   Cough  Dyspnea:  slightly  worse ex tol since onset of cough  Cough: wakes up each am marble sized thick mucus but ent eval neg for pnds and only really productive of thick or excess mucus in am x first few min up  Rest of day min mucoid not thick/ coughs so much gag/vomits p supper about once a week Talking also triggers cough  Sleep: flat bed/ one pillow  immediately starts coughing / using a lot of cough drop. SABA use: none  02: none  Rec Stop propanolol 20mg  losartan  and replace with bisoprolol 5 mg once daily  - ok to take twice daily if blood pressure top pressure is over 120  Mucinex DM 1200 mg twice daily (not as needed)  Best cough medication is tessalon (benzoate) 200 mg every 4 hours as needed GERD diet reviewed, bed blocks rec  Prednisone 10 mg take  4 each am x 2 days,   2 each am x 2 days,  1 each am x 2 days and stop  Pantoprazole 40 mg  Take 30- 60 min before your first and last meals of the day and pepcid 20 mg before bedtime   Please schedule a follow up office visit in 2 weeks, sooner if needed  with all medications /inhalers/ solutions in hand so we can verify exactly what you are taking. This includes all medications from all doctors and over the counters    12/31/2022  f/u ov/[place] office/George Oneill re: UACS vs AB maint on mucinex dm / tessalon 200 mg in am and hs - thinks bisoprolol makes him more tired than propranolol  Dyspnea: [employer] nl pace made him sob  Cough: occ thick  white < 1/2 tsp in am only otherwise dry hack with throat cleairng all day  Sleeping: once asleep no resp cc  SABA use: none  02: none   Rec Stop bisoprolol and restart propranolol  Mucinex DM 1200 mg every 12 hours until you are not coughing any more and use  benzonate 200 up to every 4-6 hours as needed to stop even the throat clearing as needed  Gabapentin 100 mg twice daily x one week then three daily for a week then four times daily thereafter For drainage / throat tickle try take CHLORPHENIRAMINE  4 mg   Please schedule a follow up office visit in 4 weeks, sooner if needed  with all respiratory medications /inhalers/ solutions in hand   02/04/2023  f/u ov/[place] office/George Oneill re: cough x dec 2023   ab vs uacs  maint on gabapentin 100 mg qid  "not better" but never got  1st gen H1 blockers per guidelines   Chief Complaint  Patient presents  with   Cough    4 follow up   Dyspnea:   Not limited by breathing from desired activities   Cough: mostly daytime / stringy white min even p uses netti pot nothing purulent  Sleeping: flat bed s resp cc noct SABA QIO:NGEX  02: none  Rec For drainage / throat tickle try take CHLORPHENIRAMINE  4 mg    Increase gabapentin 300mg  twice daily for a week then three times daily for a week and then 4 x daily. Ok to continue the tessalon up to every 4 hours  Continue famotidine take right after supper daily  Please schedule a follow up office visit in 4 weeks, sooner if needed  with all medications /inhalers/ solutions in hand    03/04/2023  f/u ov/[place] office/George Oneill re: UACS vs AB since dec 2023  maint on gabapentin/h1/ppi bid   Chief Complaint  Patient presents with   Upper airway cough syndrome   Dyspnea:  Not limited by breathing from desired activities   Cough: worse  since developed "another bad sinus infection " with MP secretioons worse in am but then throat clearing all day long min production no better on gabapentin 300 qid  Sleeping: flat bed 2 pill s    resp cc p h1 x one and tessalon> sleeps fine SABA use: none 02: none     No obvious day to day or daytime variability or assoc excess/ purulent sputum or mucus plugs or hemoptysis or cp or chest tightness, subjective wheeze or overt  hb symptoms.    Also denies any obvious fluctuation of symptoms with weather or environmental changes or other aggravating or alleviating factors except as outlined above   No unusual exposure hx or h/o childhood pna/ asthma or knowledge of premature birth.  Current Allergies, Complete Past Medical History, Past Surgical History, Family History, and Social History were reviewed in [employer] record.  ROS  The following are not active complaints unless bolded Hoarseness, sore throat, dysphagia, dental problems, itching, sneezing,  nasal congestion or discharge of excess  mucus or purulent secretions, ear ache,   fever, chills, sweats, unintended wt loss or wt gain, classically pleuritic or exertional cp,  orthopnea pnd or arm/hand swelling  or leg swelling, presyncope, palpitations, abdominal pain, anorexia, nausea, vomiting, diarrhea  or change in bowel habits or change in bladder habits, change in stools or change in urine, dysuria, hematuria,  rash, arthralgias, visual complaints, headache, numbness, weakness or ataxia or problems with  walking or coordination,  change in mood or  memory.        Current Meds  Medication Sig   chlorpheniramine (CHLOR-TRIMETON) 4 MG tablet Take 4 mg by mouth 2 (two) times daily as needed for allergies.   Dextromethorphan-guaiFENesin (MUCINEX DM MAXIMUM STRENGTH) 60-1200 MG TB12 Take by mouth.   famotidine (PEPCID) 40 MG tablet Take 40 mg by mouth daily.   gabapentin (NEURONTIN) 300 MG capsule Take 1 capsule (300 mg total) by mouth 4 (four) times daily.   losartan (COZAAR) 25 MG tablet Take 25 mg by mouth daily.   metFORMIN (GLUCOPHAGE-XR) 500 MG 24 hr tablet Take 500 mg by mouth 2 (two) times daily.   OZEMPIC, 0.25 OR 0.5 MG/DOSE, 2 MG/3ML SOPN Inject 0.5 mg into the skin every Wednesday.   pantoprazole (PROTONIX) 40 MG tablet Take 1 tablet (40 mg total) by mouth 2 (two) times daily.   traZODone (DESYREL) 100 MG tablet Take 100 mg by mouth at bedtime as needed for sleep.         Past Medical History:  Diagnosis Date   Arthritis    Chronic kidney disease    hx of kidney stones   Chronic thrombocytopenia    Diabetes mellitus    Epistaxis    Excessive bleeding    Gout    History of kidney stones    History of staph infection    Hyperlipidemia    Hypertension    NASH (nonalcoholic steatohepatitis)    Pancytopenia (HCC)    Vitamin B12 deficiency    Vitamin D deficiency       Objective:    Wts   03/04/2023     209  02/04/2023       210   12/31/22 207 lb (93.9 kg)  12/16/22 209 lb (94.8 kg)  12/09/22 209 lb  6.4 oz (95 kg)     Vital signs reviewed  03/04/2023  - Note at rest 02 sats  94% on RA   General appearance:    amb wm freq throat clearing          HEENT : Oropharynx  clear      Nasal turbinates MP discharge R > L    NECK :  without  apparent JVD/ palpable Nodes/TM    LUNGS: no acc muscle use,  Nl contour chest which is clear to A and P bilaterally without cough on insp or exp maneuvers (no longer gagging on insp)   CV:  RRR  no s3 or murmur or increase in P2, and no edema   ABD:  soft and nontender with nl inspiratory excursion in the supine position. No bruits or organomegaly appreciated   MS:  Nl gait/ ext warm without deformities Or obvious joint restrictions  calf tenderness, cyanosis or clubbing    SKIN: warm and dry without lesions    NEURO:  alert, approp, nl sensorium with  no motor or cerebellar deficits apparent.                Assessment

## 2023-03-04 ENCOUNTER — Ambulatory Visit (INDEPENDENT_AMBULATORY_CARE_PROVIDER_SITE_OTHER): Payer: No Typology Code available for payment source | Admitting: Internal Medicine

## 2023-03-04 ENCOUNTER — Encounter: Payer: Self-pay | Admitting: Internal Medicine

## 2023-03-04 VITALS — BP 109/64 | HR 89 | Ht 73.0 in | Wt 209.0 lb

## 2023-03-04 DIAGNOSIS — I10 Essential (primary) hypertension: Secondary | ICD-10-CM | POA: Diagnosis not present

## 2023-03-04 DIAGNOSIS — R058 Other specified cough: Secondary | ICD-10-CM | POA: Diagnosis not present

## 2023-03-04 MED ORDER — CEFDINIR 300 MG PO CAPS
300.0000 mg | ORAL_CAPSULE | Freq: Two times a day (BID) | ORAL | 0 refills | Status: DC
Start: 2023-03-04 — End: 2023-10-19

## 2023-03-04 MED ORDER — METHYLPREDNISOLONE ACETATE 80 MG/ML IJ SUSP
120.0000 mg | Freq: Once | INTRAMUSCULAR | Status: AC
Start: 2023-03-04 — End: 2023-03-04
  Administered 2023-03-04: 120 mg via INTRAMUSCULAR

## 2023-03-04 NOTE — Patient Instructions (Addendum)
Taper off the gabapentin one pill a week and see if the tremor improves or cough/gag   gets worse   Omnicef 300 mg twice daily x 10 days   Depomerol 120 mg IM   For cough/tickle  if can't  tolerate sleepiness from chlorpheniramine then use the tessalon 200 every 4 hours   Please remember to go to the lab department   for your tests - we will call you with the results when they are available.   Please schedule a follow up office visit in 6 weeks, call sooner if needed

## 2023-03-05 NOTE — Assessment & Plan Note (Addendum)
Onset p egd 04/2012  - Try off inderal and losartan 12/16/2022 and suppress with mucinex dm/ tessalon 200 / hard rock candy plus pred x 6 d > no better 12/31/2022  - gabapentin 100 mg bid titrate max 100 qid  and 1st gen H1 blockers per guidelines  and f/u in 4 weeks > did not get 1st gen H1 blockers per guidelines   - 02/04/2023 titrate up gabapentin to 300 mg qid and start 1st gen H1 blockers per guidelines   - Allergy screen 03/04/2023 >  Eos 0. /  IgE   - Referred to Dr Delford Field at [facility]  03/04/2023 >>> - 03/04/2023 taper down gabapentin ? 1200 mg daily causing tremor?      Cough is better (no longer gagging quality)  but now has obvious purulent rhinosinusitis so will check allergy profile and refer for ENT second opinion with allergy referral in meantime if screen is positive.   For now  rx depomedrol 120 mg IM and  add omnicef 300 mg bid(record indicates rash to pcn but no problem with rocephin)  x 10 days  and ok to taper off gabapentin to level where not tremulous as long as gagging cough does not recur (seeking lowest effective dose with tolerable level of perceived side effects / eliminate throat clearing with 1st gen H1 blockers per guidelines  and tessalon 200          Each maintenance medication was reviewed in detail including emphasizing most importantly the difference between maintenance and prns and under what circumstances the prns are to be triggered using an action plan format where appropriate.  Total time for H and P, chart review, counseling,   and generating customized AVS unique to this office visit / same day charting  > 30 min for   refractory respiratory  symptoms of uncertain etiology

## 2023-03-08 ENCOUNTER — Telehealth: Payer: Self-pay | Admitting: Internal Medicine

## 2023-03-08 LAB — CBC WITH DIFFERENTIAL/PLATELET
Basophils Absolute: 0 10*3/uL (ref 0.0–0.2)
Basos: 1 %
EOS (ABSOLUTE): 0.1 10*3/uL (ref 0.0–0.4)
Eos: 3 %
Hematocrit: 38.4 % (ref 37.5–51.0)
Hemoglobin: 12.5 g/dL — ABNORMAL LOW (ref 13.0–17.7)
Immature Grans (Abs): 0 10*3/uL (ref 0.0–0.1)
Immature Granulocytes: 1 %
Lymphocytes Absolute: 0.8 10*3/uL (ref 0.7–3.1)
Lymphs: 20 %
MCH: 30.4 pg (ref 26.6–33.0)
MCHC: 32.6 g/dL (ref 31.5–35.7)
MCV: 93 fL (ref 79–97)
Monocytes Absolute: 0.4 10*3/uL (ref 0.1–0.9)
Monocytes: 10 %
Neutrophils Absolute: 2.5 10*3/uL (ref 1.4–7.0)
Neutrophils: 65 %
Platelets: 50 10*3/uL — CL (ref 150–450)
RBC: 4.11 x10E6/uL — ABNORMAL LOW (ref 4.14–5.80)
RDW: 13.7 % (ref 11.6–15.4)
WBC: 3.8 10*3/uL (ref 3.4–10.8)

## 2023-03-08 LAB — IGE: IgE (Immunoglobulin E), Serum: 36 [IU]/mL (ref 6–495)

## 2023-03-08 NOTE — Telephone Encounter (Signed)
Patient calling to confirm appointment with Dr. Delford Field 06/15/2022 at 2:00 pm. Received text from office with another provider and time. Patient phone number is [phone].

## 2023-03-15 ENCOUNTER — Other Ambulatory Visit: Payer: Self-pay | Admitting: *Deleted

## 2023-03-15 DIAGNOSIS — K7460 Unspecified cirrhosis of liver: Secondary | ICD-10-CM

## 2023-03-26 ENCOUNTER — Encounter: Payer: Self-pay | Admitting: *Deleted

## 2023-04-15 NOTE — Progress Notes (Signed)
George Oneill, male    DOB: Dec 02, 1960    MRN: [id number]   Brief patient profile:  45  yowm never smoker  referred to pulmonary clinic in [place]  12/16/2022 by ENT in [place]/GI doctor in [place]  for cough onset Dec 2023    GI noted 10/08/22 Went to [facility] and saw   PA there regarding his cough.    Cough x 3-6 months. Wife noticed it more after that last endoscopy in December for varices. Having throat clearing and spit up of clear phlegm and and a cough. At times this is a violent cough at night and will throw up. Feels like he has drainage down his throat. When he eats is when he feels it going down. Does not have to blow his nose. ENT stopped his lisinopril about a month prior to OV  . Ran camera down his nose and saw a lot of inflammation around voice box.    Taking famotidine nightly and pantoprazole twice daily. Cough is worse with eating. Denies dysphagia. Family doctor gave him an antibiotics and prednisone and that helped and then went back and he was given a steroid injection and told to take Flonase. States flonase did not help at all. ENT told him to start zyrtec and he states that isn't helping. Lisinopril d/c initially helped but it came back and wife states it may be slightly worse than prior. Living on cough drops and that helps shortly.  Has also taken Delsym at home.    Allergy testing by ent pos only for mold   History of Present Illness  12/16/2022  Pulmonary/ 1st office eval/ George Oneill / [facility] on ppi at bfast/ p supper along with pepcid  Chief Complaint  Patient presents with   Establish Care   Cough  Dyspnea:  slightly  worse ex tol since onset of cough  Cough: wakes up each am marble sized thick mucus but ent eval neg for pnds and only really productive of thick or excess mucus in am x first few min up  Rest of day min mucoid not thick/ coughs so much gag/vomits p supper about once a week Talking also triggers cough  Sleep: flat bed/ one pillow  immediately starts coughing / using a lot of cough drop. SABA use: none  02: none  Rec Stop propanolol 20mg  losartan  and replace with bisoprolol 5 mg once daily  - ok to take twice daily if blood pressure top pressure is over 120  Mucinex DM 1200 mg twice daily (not as needed)  Best cough medication is tessalon (benzoate) 200 mg every 4 hours as needed GERD diet reviewed, bed blocks rec  Prednisone 10 mg take  4 each am x 2 days,   2 each am x 2 days,  1 each am x 2 days and stop  Pantoprazole 40 mg  Take 30- 60 min before your first and last meals of the day and pepcid 20 mg before bedtime     03/04/2023  f/u ov/[place] office/George Oneill re: UACS vs AB since dec 2023  maint on gabapentin/h1/ppi bid Chief Complaint  Patient presents with   Upper airway cough syndrome   Dyspnea:  Not limited by breathing from desired activities   Cough: worse  since developed "another bad sinus infection " with MP secretioons worse in am but then throat clearing all day long min production no better on gabapentin 300 qid  Sleeping: flat bed 2 pillows  s resp cc p  h1 x one and tessalon> sleeps fine SABA use: none 02: none   Rec Taper off the gabapentin one pill a week and see if the tremor improves or cough/gag   gets worse  Omnicef 300 mg twice daily x 10 days  Depomerol 120 mg IM  For cough/tickle  if can't  tolerate sleepiness from chlorpheniramine then use the tessalon 200 every 4 hours     - Allergy screen 03/04/2023 >  Eos 0.1 /  IgE 36  - Referred to Dr Delford Field at [facility]  03/04/2023 >>>   04/16/2023  f/u ov/[place] office/George Oneill re: UACS vs AB since dec 2023  maint on gerd rx  But  h1 only max of 2 per day, do not make him sleepy with increase ant nasal discharge, not using atrovent on hand either  Chief Complaint  Patient presents with   Follow-up    Coughing  with mucus taking  otc cough medication hasn't help any    Dyspnea:  not limited but very inactive   Cough: 15 min p eating starts  coughing/ nose drips after eating also   Sleeping: flat bed/ 2 pillows one h1 and tessalon s    resp cc  SABA use: none  02: none      No obvious day to day or daytime variability or assoc excess/ purulent sputum or mucus plugs or hemoptysis or cp or chest tightness, subjective wheeze or overt   hb symptoms.    Also denies any obvious fluctuation of symptoms with weather or environmental changes or other aggravating or alleviating factors except as outlined above   No unusual exposure hx or h/o childhood pna/ asthma or knowledge of premature birth.  Current Allergies, Complete Past Medical History, Past Surgical History, Family History, and Social History were reviewed in [employer] record.  ROS  The following are not active complaints unless bolded Hoarseness, sore throat, dysphagia, dental problems, itching, sneezing,  nasal congestion or discharge of excess mucus or purulent secretions, ear ache,   fever, chills, sweats, unintended wt loss or wt gain, classically pleuritic or exertional cp,  orthopnea pnd or arm/hand swelling  or leg swelling, presyncope, palpitations, abdominal pain, anorexia, nausea, vomiting, diarrhea  or change in bowel habits or change in bladder habits, change in stools or change in urine, dysuria, hematuria,  rash, arthralgias, visual complaints, headache, numbness, weakness or ataxia or problems with walking or coordination,  change in mood or  memory.        Current Meds  Medication Sig   cefdinir (OMNICEF) 300 MG capsule Take 1 capsule (300 mg total) by mouth 2 (two) times daily.   chlorpheniramine (CHLOR-TRIMETON) 4 MG tablet Take 4 mg by mouth 2 (two) times daily as needed for allergies.   Dextromethorphan-guaiFENesin (MUCINEX DM MAXIMUM STRENGTH) 60-1200 MG TB12 Take by mouth.   famotidine (PEPCID) 40 MG tablet Take 40 mg by mouth daily.   gabapentin (NEURONTIN) 300 MG capsule Take 1 capsule (300 mg total) by mouth 4 (four) times  daily.   losartan (COZAAR) 25 MG tablet Take 25 mg by mouth daily.   metFORMIN (GLUCOPHAGE-XR) 500 MG 24 hr tablet Take 500 mg by mouth 2 (two) times daily.   OZEMPIC, 0.25 OR 0.5 MG/DOSE, 2 MG/3ML SOPN Inject 0.5 mg into the skin every Wednesday.   pantoprazole (PROTONIX) 40 MG tablet Take 1 tablet (40 mg total) by mouth 2 (two) times daily.   traZODone (DESYREL) 100 MG tablet Take 100 mg by mouth  at bedtime as needed for sleep.           Past Medical History:  Diagnosis Date   Arthritis    Chronic kidney disease    hx of kidney stones   Chronic thrombocytopenia    Diabetes mellitus    Epistaxis    Excessive bleeding    Gout    History of kidney stones    History of staph infection    Hyperlipidemia    Hypertension    NASH (nonalcoholic steatohepatitis)    Pancytopenia (HCC)    Vitamin B12 deficiency    Vitamin D deficiency       Objective:    Wts  04/16/2023       204  03/04/2023     209  02/04/2023       210   12/31/22 207 lb (93.9 kg)  12/16/22 209 lb (94.8 kg)  12/09/22 209 lb 6.4 oz (95 kg)    Vital signs reviewed  04/16/2023  - Note at rest 02 sats  94% on RA   General appearance:    amb pleasant wm nad / no spont cough at all during interview/ exam   HEENT : Oropharynx  clear      Nasal turbinates mild non-specific TE s polyps/ purulence or excess discharge    NECK :  without  apparent JVD/ palpable Nodes/TM    LUNGS: no acc muscle use,  Nl contour chest which is clear to A and P bilaterally without cough on insp or exp maneuvers   CV:  RRR  no s3 or murmur or increase in P2, and no edema   ABD:  soft and nontender    MS:  Nl gait/ ext warm without deformities Or obvious joint restrictions  calf tenderness, cyanosis or clubbing    SKIN: warm and dry without lesions    NEURO:  alert, approp, nl sensorium with  no motor or cerebellar deficits apparent.         Assessment

## 2023-04-16 ENCOUNTER — Encounter: Payer: Self-pay | Admitting: Internal Medicine

## 2023-04-16 ENCOUNTER — Ambulatory Visit: Payer: No Typology Code available for payment source | Admitting: Internal Medicine

## 2023-04-16 VITALS — BP 136/76 | HR 91 | Ht 73.0 in | Wt 204.4 lb

## 2023-04-16 DIAGNOSIS — R058 Other specified cough: Secondary | ICD-10-CM

## 2023-04-16 MED ORDER — METHYLPREDNISOLONE ACETATE 80 MG/ML IJ SUSP
80.0000 mg | Freq: Once | INTRAMUSCULAR | Status: AC
  Administered 2023-04-16: 80 mg via INTRAMUSCULAR

## 2023-04-16 NOTE — Assessment & Plan Note (Signed)
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 1200 mg daily causing tremor? > tol 600 mg per day but taking the 2nd dose at hs and rc he take at bfast and supper since cough noct not as much of a problem   Persistent though somewhat improved globus sensation so rec:  For drainage / throat tickle try take CHLORPHENIRAMINE  4 mg  ("Allergy Relief" 4mg   at [facility] should be easiest to find in the blue box usually on bottom shelf)  take one or two every 4 hours as needed   but esp 2 prior to bed   Try stopping the tessalon 200 mg except maybe the one at bedtime if still needed on double dosing chlorpheniramine   Take your pm gabapentin before supper  See your ENT in [place] if your nose continues to drip after firt  trying atrovent nasal nasal spray  Depomedrol 120 mg IM   Pulmonary follow up is as needed            Each maintenance medication was reviewed in detail including emphasizing most importantly the difference between maintenance and prns and under what circumstances the prns are to be triggered using an action plan format where appropriate.  Total time for H and P, chart review, counseling, reviewing nasal  device(s) and generating customized AVS unique to this office visit / same day charting  > 30 min for multiple  refractory respiratory  symptoms of uncertain etiology

## 2023-04-16 NOTE — Patient Instructions (Addendum)
For drainage / throat tickle try take CHLORPHENIRAMINE  4 mg  ("Allergy Relief" 4mg   at [facility] should be easiest to find in the blue box usually on bottom shelf)  take one- or two every 4 hours as needed     Try stopping the tessalon 200 mg except maybe the one at bedtime  Take your pm gabapentin before supper  See your ENT in [place] if your nose continues to drip after trying atrovent nasal nasal spray  Depomedrol 120 mg IM   Pulmonary follow up is as needed

## 2023-05-13 NOTE — Telephone Encounter (Signed)
 noted

## 2023-05-13 NOTE — Telephone Encounter (Signed)
 Pt is needing to be scheduled for an us , see last ov note. Pt was also told to have labs done that was ordered in Nov, that the labs that Dr. Darlean done were not the ones we needed. Pt is wanting to wait to schedule the us  until after his appt with specialist on 06/16/23.

## 2023-06-14 ENCOUNTER — Ambulatory Visit: Payer: No Typology Code available for payment source | Admitting: Gastroenterology

## 2023-06-15 ENCOUNTER — Ambulatory Visit: Payer: No Typology Code available for payment source | Admitting: Gastroenterology

## 2023-08-09 ENCOUNTER — Other Ambulatory Visit: Payer: Self-pay | Admitting: Gastroenterology

## 2023-08-09 DIAGNOSIS — K219 Gastro-esophageal reflux disease without esophagitis: Secondary | ICD-10-CM

## 2023-08-11 ENCOUNTER — Telehealth: Payer: Self-pay | Admitting: Gastroenterology

## 2023-08-11 NOTE — Telephone Encounter (Signed)
 Error. Medication appeal form being completed for pantoprazole 40 mg BID.

## 2023-08-12 ENCOUNTER — Emergency Department (HOSPITAL_COMMUNITY)
Admission: EM | Admit: 2023-08-12 | Discharge: 2023-08-13 | Disposition: A | Discharge status: Home / Self Care | Attending: Emergency Medicine | Admitting: Emergency Medicine

## 2023-08-12 ENCOUNTER — Other Ambulatory Visit: Payer: Self-pay

## 2023-08-12 ENCOUNTER — Emergency Department (HOSPITAL_COMMUNITY)

## 2023-08-12 ENCOUNTER — Encounter (HOSPITAL_COMMUNITY): Payer: Self-pay

## 2023-08-12 DIAGNOSIS — R0602 Shortness of breath: Secondary | ICD-10-CM | POA: Insufficient documentation

## 2023-08-12 DIAGNOSIS — Z79899 Other long term (current) drug therapy: Secondary | ICD-10-CM | POA: Insufficient documentation

## 2023-08-12 LAB — CBC WITH DIFFERENTIAL/PLATELET
Abs Immature Granulocytes: 0.01 10*3/uL (ref 0.00–0.07)
Basophils Absolute: 0 10*3/uL (ref 0.0–0.1)
Basophils Relative: 1 %
Eosinophils Absolute: 0.1 10*3/uL (ref 0.0–0.5)
Eosinophils Relative: 3 %
HCT: 35.8 % — ABNORMAL LOW (ref 39.0–52.0)
Hemoglobin: 11.4 g/dL — ABNORMAL LOW (ref 13.0–17.0)
Immature Granulocytes: 0 %
Lymphocytes Relative: 26 %
Lymphs Abs: 0.8 10*3/uL (ref 0.7–4.0)
MCH: 28.6 pg (ref 26.0–34.0)
MCHC: 31.8 g/dL (ref 30.0–36.0)
MCV: 89.7 fL (ref 80.0–100.0)
Monocytes Absolute: 0.3 10*3/uL (ref 0.1–1.0)
Monocytes Relative: 9 %
Neutro Abs: 1.8 10*3/uL (ref 1.7–7.7)
Neutrophils Relative %: 61 %
Platelets: 36 10*3/uL — ABNORMAL LOW (ref 150–400)
RBC: 3.99 MIL/uL — ABNORMAL LOW (ref 4.22–5.81)
RDW: 14.7 % (ref 11.5–15.5)
WBC: 3 10*3/uL — ABNORMAL LOW (ref 4.0–10.5)
nRBC: 0 % (ref 0.0–0.2)

## 2023-08-12 LAB — BASIC METABOLIC PANEL WITH GFR
Anion gap: 9 (ref 5–15)
BUN: 17 mg/dL (ref 8–23)
CO2: 26 mmol/L (ref 22–32)
Calcium: 9 mg/dL (ref 8.9–10.3)
Chloride: 99 mmol/L (ref 98–111)
Creatinine, Ser: 1.4 mg/dL — ABNORMAL HIGH (ref 0.61–1.24)
GFR, Estimated: 56 mL/min — ABNORMAL LOW (ref >60)
Glucose, Bld: 151 mg/dL — ABNORMAL HIGH (ref 70–99)
Potassium: 3.8 mmol/L (ref 3.5–5.1)
Sodium: 134 mmol/L — ABNORMAL LOW (ref 135–145)

## 2023-08-12 MED ORDER — LEVOFLOXACIN 750 MG PO TABS: 750.0000 mg | ORAL_TABLET | Freq: Every evening | ORAL | 0 refills | Status: DC

## 2023-08-12 MED ORDER — PREDNISONE 20 MG PO TABS
40.0000 mg | ORAL_TABLET | Freq: Once | ORAL | Status: AC
  Administered 2023-08-12: 40 mg via ORAL
  Filled 2023-08-12: qty 2

## 2023-08-12 MED ORDER — ALBUTEROL SULFATE (2.5 MG/3ML) 0.083% IN NEBU
3.0000 mL | INHALATION_SOLUTION | RESPIRATORY_TRACT | Status: DC | PRN
  Administered 2023-08-12: 3 mL via RESPIRATORY_TRACT
  Filled 2023-08-12: qty 3

## 2023-08-12 MED ORDER — LEVOFLOXACIN 500 MG PO TABS
500.0000 mg | ORAL_TABLET | Freq: Once | ORAL | Status: AC
  Administered 2023-08-12: 500 mg via ORAL
  Filled 2023-08-12: qty 1

## 2023-08-12 MED ORDER — PREDNISONE 20 MG PO TABS: 40.0000 mg | ORAL_TABLET | Freq: Every evening | ORAL | 0 refills | Status: DC

## 2023-08-12 MED ORDER — ALBUTEROL SULFATE (2.5 MG/3ML) 0.083% IN NEBU: 2.5000 mg | INHALATION_SOLUTION | Freq: Four times a day (QID) | RESPIRATORY_TRACT | 12 refills | Status: DC | PRN

## 2023-08-12 MED ORDER — LEVOFLOXACIN 500 MG PO TABS: 500.0000 mg | ORAL_TABLET | Freq: Every evening | ORAL | 0 refills | Status: DC

## 2023-08-12 NOTE — ED Provider Notes (Signed)
 [place] EMERGENCY DEPARTMENT AT [facility] Provider Note   CSN: [id number] Arrival date & time: 08/12/23  1902     History  Chief Complaint  Patient presents with   Shortness of Breath    George Oneill is a 63 y.o. male.  Patient presents to the emergency department with concerns over shortness of breath.  Patient reports that he has a history of vocal cord dysfunction and he is under care of an ENT and speech therapist for this.  Over the last 3 or 4 days his shortness of breath has worsened.  He has had some mild shortness of breath with this condition all along over the last year or so but recently he feels like his throat is closing when he lies down flat on his back.  He has a cough but it is unchanged from his chronic cough that has been determined to be secondary to his vocal cord dysfunction.       Home Medications Prior to Admission medications   Medication Sig Start Date End Date Taking? Authorizing Provider  albuterol (PROVENTIL) (2.5 MG/3ML) 0.083% nebulizer solution Take 3 mLs (2.5 mg total) by nebulization every 6 (six) hours as needed for wheezing or shortness of breath. 08/12/23  Yes Malakhai Beitler, Canary Brim, MD  predniSONE (DELTASONE) 20 MG tablet Take 2 tablets (40 mg total) by mouth every evening. 08/12/23  Yes Kshawn Canal, Canary Brim, MD  cefdinir (OMNICEF) 300 MG capsule Take 1 capsule (300 mg total) by mouth 2 (two) times daily. 03/04/23   Nyoka Cowden, MD  chlorpheniramine (CHLOR-TRIMETON) 4 MG tablet Take 4 mg by mouth 2 (two) times daily as needed for allergies.    [provider]  Dextromethorphan-guaiFENesin (MUCINEX DM MAXIMUM STRENGTH) 60-1200 MG TB12 Take by mouth.    [provider]  famotidine (PEPCID) 40 MG tablet Take 40 mg by mouth daily. 09/07/22   [provider]  gabapentin (NEURONTIN) 300 MG capsule Take 1 capsule (300 mg total) by mouth 4 (four) times daily. 02/04/23   Nyoka Cowden, MD  levofloxacin  (LEVAQUIN) 500 MG tablet Take 1 tablet (500 mg total) by mouth every evening. 08/12/23   Gilda Crease, MD  losartan (COZAAR) 25 MG tablet Take 25 mg by mouth daily. 02/04/23   [provider]  metFORMIN (GLUCOPHAGE-XR) 500 MG 24 hr tablet Take 500 mg by mouth 2 (two) times daily.    [provider]  OZEMPIC, 0.25 OR 0.5 MG/DOSE, 2 MG/3ML SOPN Inject 0.5 mg into the skin every Wednesday. 01/02/22   [provider]  pantoprazole (PROTONIX) 40 MG tablet TAKE 1 TABLET(40 MG) BY MOUTH TWICE DAILY 08/09/23   Aida Raider, NP  traZODone (DESYREL) 100 MG tablet Take 100 mg by mouth at bedtime as needed for sleep. 09/04/21   [provider]      Allergies    Penicillins    Review of Systems   Review of Systems  Physical Exam Updated Vital Signs BP 131/69   Pulse 84   Temp 98.9 F (37.2 C) (Oral)   Resp (!) 24   Ht 6\' 1"  (1.854 m)   Wt 93 kg   SpO2 93%   BMI 27.05 kg/m  Physical Exam Vitals and nursing note reviewed.  Constitutional:      General: He is not in acute distress.    Appearance: He is well-developed.  HENT:     Head: Normocephalic and atraumatic.     Mouth/Throat:  Mouth: Mucous membranes are moist.  Eyes:     General: Vision grossly intact. Gaze aligned appropriately.     Extraocular Movements: Extraocular movements intact.     Conjunctiva/sclera: Conjunctivae normal.  Cardiovascular:     Rate and Rhythm: Normal rate and regular rhythm.     Pulses: Normal pulses.     Heart sounds: Normal heart sounds, S1 normal and S2 normal. No murmur heard.    No friction rub. No gallop.  Pulmonary:     Effort: Pulmonary effort is normal. No respiratory distress.     Breath sounds: Normal breath sounds.  Abdominal:     Palpations: Abdomen is soft.     Tenderness: There is no abdominal tenderness. There is no guarding or rebound.     Hernia: No hernia is present.  Musculoskeletal:        General: No swelling.     Cervical back:  Full passive range of motion without pain, normal range of motion and neck supple. No pain with movement, spinous process tenderness or muscular tenderness. Normal range of motion.     Right lower leg: No edema.     Left lower leg: No edema.  Skin:    General: Skin is warm and dry.     Capillary Refill: Capillary refill takes less than 2 seconds.     Findings: No ecchymosis, erythema, lesion or wound.  Neurological:     Mental Status: He is alert and oriented to person, place, and time.     GCS: GCS eye subscore is 4. GCS verbal subscore is 5. GCS motor subscore is 6.     Cranial Nerves: Cranial nerves 2-12 are intact.     Sensory: Sensation is intact.     Motor: Motor function is intact. No weakness or abnormal muscle tone.     Coordination: Coordination is intact.  Psychiatric:        Mood and Affect: Mood normal.        Speech: Speech normal.        Behavior: Behavior normal.     ED Results / Procedures / Treatments   Labs (all labs ordered are listed, but only abnormal results are displayed) Labs Reviewed  CBC WITH DIFFERENTIAL/PLATELET - Abnormal; Notable for the following components:      Result Value   WBC 3.0 (*)    RBC 3.99 (*)    Hemoglobin 11.4 (*)    HCT 35.8 (*)    Platelets 36 (*)    All other components within normal limits  BASIC METABOLIC PANEL WITH GFR - Abnormal; Notable for the following components:   Sodium 134 (*)    Glucose, Bld 151 (*)    Creatinine, Ser 1.40 (*)    GFR, Estimated 56 (*)    All other components within normal limits    EKG EKG Interpretation Date/Time:  Thursday August 12 2023 19:18:53 EDT Ventricular Rate:  83 PR Interval:  142 QRS Duration:  82 QT Interval:  392 QTC Calculation: 460 R Axis:   37  Text Interpretation: Normal sinus rhythm Nonspecific ST and T wave abnormality Abnormal ECG When compared with ECG of 20-Nov-2021 11:32, Nonspecific T wave abnormality no longer evident in Anterolateral leads Confirmed by Alvino Blood ([id number]) on 08/12/2023 9:37:57 PM  Radiology DG Chest 2 View Result Date: 08/12/2023 CLINICAL DATA:  Shortness of breath for several days. EXAM: CHEST - 2 VIEW COMPARISON:  December 16, 2022. FINDINGS: The heart size and mediastinal contours are within normal limits.  Both lungs are clear. The visualized skeletal structures are unremarkable. IMPRESSION: No active cardiopulmonary disease. Electronically Signed   By: Lupita Raider M.D.   On: 08/12/2023 19:33    Procedures Procedures    Medications Ordered in ED Medications  albuterol (PROVENTIL) (2.5 MG/3ML) 0.083% nebulizer solution 3 mL (has no administration in time range)  predniSONE (DELTASONE) tablet 40 mg (has no administration in time range)  levofloxacin (LEVAQUIN) tablet 500 mg (has no administration in time range)    ED Course/ Medical Decision Making/ A&P                                 Medical Decision Making Amount and/or Complexity of Data Reviewed Radiology: ordered.  Risk Prescription drug management.   Patient appears well.  He is in no distress.  Oxygen saturations are normal.  Vital signs are normal, no fever, no tachycardia.  EKG unchanged from prior, no ischemia.  Chest x-ray does not show any evidence of pneumonia.  There is no cardiomegaly or signs of heart failure.  Examination does not reveal any wheezing and he does not have any stridor.  Patient was given a nebulizer by speech therapy to use saline to help soothe his airways.  He used this last night but it did not seem to help.  There may be some bronchospasm intermittently.  I do not see any harm in actually adding albuterol to the nebulizer machine as needed.  He may have some upper airway inflammation, add prednisone burst.  Although x-ray is clear, he does have a history of reflux and with his vocal cord dysfunction, aspiration needs to be considered.  Will treat with course of Levaquin.  He will follow-up with speech therapy and ENT, as well as his  pulmonologist.        Final Clinical Impression(s) / ED Diagnoses Final diagnoses:  SOB (shortness of breath)    Rx / DC Orders ED Discharge Orders          Ordered    predniSONE (DELTASONE) 20 MG tablet  Every evening        08/12/23 2317    levofloxacin (LEVAQUIN) 750 MG tablet  Every evening,   Status:  Discontinued        08/12/23 2317    albuterol (PROVENTIL) (2.5 MG/3ML) 0.083% nebulizer solution  Every 6 hours PRN        08/12/23 2317    levofloxacin (LEVAQUIN) 500 MG tablet  Every evening        08/12/23 2318              Gilda Crease, MD 08/12/23 2318

## 2023-08-12 NOTE — ED Triage Notes (Signed)
 Complaining of shortness of breath for 3-4 days. Cough for close to a year. Feels pressure in the center of chest when he lays down.
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
## 2023-10-19 ENCOUNTER — Ambulatory Visit (INDEPENDENT_AMBULATORY_CARE_PROVIDER_SITE_OTHER): Admitting: Gastroenterology

## 2023-10-19 ENCOUNTER — Encounter: Payer: Self-pay | Admitting: *Deleted

## 2023-10-19 ENCOUNTER — Encounter: Payer: Self-pay | Admitting: Gastroenterology

## 2023-10-19 VITALS — BP 125/68 | HR 86 | Temp 98.2°F | Ht 73.0 in | Wt 196.8 lb

## 2023-10-19 DIAGNOSIS — Z8719 Personal history of other diseases of the digestive system: Secondary | ICD-10-CM

## 2023-10-19 DIAGNOSIS — K219 Gastro-esophageal reflux disease without esophagitis: Secondary | ICD-10-CM

## 2023-10-19 DIAGNOSIS — R053 Chronic cough: Secondary | ICD-10-CM

## 2023-10-19 DIAGNOSIS — K7581 Nonalcoholic steatohepatitis (NASH): Secondary | ICD-10-CM | POA: Diagnosis not present

## 2023-10-19 DIAGNOSIS — R1310 Dysphagia, unspecified: Secondary | ICD-10-CM | POA: Diagnosis not present

## 2023-10-19 DIAGNOSIS — K7460 Unspecified cirrhosis of liver: Secondary | ICD-10-CM

## 2023-10-19 DIAGNOSIS — R197 Diarrhea, unspecified: Secondary | ICD-10-CM

## 2023-10-19 DIAGNOSIS — I8511 Secondary esophageal varices with bleeding: Secondary | ICD-10-CM

## 2023-10-19 NOTE — Patient Instructions (Addendum)
 Please have blood work completed at [facility].  We will call you with results once they have been received. Please allow 3-5 business days for review. 2 locations for [facility] in [place]:              1. [address], [place]              2. [address] [place]   We will get you scheduled for US  of you liver in the near future.  Cirrhosis Lifestyle Recommendations:  High-protein diet from a primarily plant-based diet. Avoid red meat.  No raw or undercooked meat, seafood, or shellfish. Low-fat/cholesterol/carbohydrate diet. Limit sodium to no more than 2000 mg/day including everything that you eat and drink. Recommend at least 30 minutes of aerobic and resistance exercise 3 days/week. Limit Tylenol  to 2000 mg daily.   Continue reflux gourmet. You may take more than just nightly - if you feel like it helps you can take before meals.      If you take the powder you should take 1 tablespoon once daily in at least 8 oz of water . If you take capsules start with 2 once daily and can increase to twice daily if tolerated well.   We will send referral to [facility] for esophageal manometry - please let me know if you do not here from them within the next 2-3 weeks. I have attached some info to the back of your paperwork today.   Follow up in 6 months  It was a pleasure to see you today. I want to create trusting relationships with patients. If you receive a survey regarding your visit,  I greatly appreciate you taking time to fill this out on paper or through your MyChart. I value your feedback.  Julian Obey, MSN, FNP-BC, AGACNP-BC [facility]

## 2023-10-25 ENCOUNTER — Ambulatory Visit (HOSPITAL_COMMUNITY)
Admission: RE | Admit: 2023-10-25 | Discharge: 2023-10-25 | Disposition: A | Discharge status: Home / Self Care | Source: Ambulatory Visit | Attending: Gastroenterology | Admitting: Gastroenterology

## 2023-10-25 DIAGNOSIS — K7460 Unspecified cirrhosis of liver: Secondary | ICD-10-CM | POA: Diagnosis present

## 2023-10-25 DIAGNOSIS — K7581 Nonalcoholic steatohepatitis (NASH): Secondary | ICD-10-CM | POA: Insufficient documentation

## 2023-10-26 ENCOUNTER — Ambulatory Visit: Payer: Self-pay | Admitting: Gastroenterology

## 2023-10-26 LAB — PROTIME-INR
INR: 1.2 (ref 0.9–1.2)
Prothrombin Time: 12.8 s — ABNORMAL HIGH (ref 9.1–12.0)

## 2023-10-26 LAB — CBC
Hematocrit: 36.3 % — ABNORMAL LOW (ref 37.5–51.0)
Hemoglobin: 11.3 g/dL — ABNORMAL LOW (ref 13.0–17.7)
MCH: 28 pg (ref 26.6–33.0)
MCHC: 31.1 g/dL — ABNORMAL LOW (ref 31.5–35.7)
MCV: 90 fL (ref 79–97)
RBC: 4.03 x10E6/uL — ABNORMAL LOW (ref 4.14–5.80)
RDW: 15.1 % (ref 11.6–15.4)
WBC: 2.8 10*3/uL — ABNORMAL LOW (ref 3.4–10.8)

## 2023-10-26 LAB — IRON,TIBC AND FERRITIN PANEL
Ferritin: 24 ng/mL — ABNORMAL LOW (ref 30–400)
Iron Saturation: 14 % — ABNORMAL LOW (ref 15–55)
Iron: 53 ug/dL (ref 38–169)
Total Iron Binding Capacity: 370 ug/dL (ref 250–450)
UIBC: 317 ug/dL (ref 111–343)

## 2023-10-26 LAB — COMPREHENSIVE METABOLIC PANEL WITH GFR
ALT: 18 IU/L (ref 0–44)
AST: 21 IU/L (ref 0–40)
Albumin: 3.9 g/dL (ref 3.9–4.9)
Alkaline Phosphatase: 77 IU/L (ref 44–121)
BUN/Creatinine Ratio: 17 (ref 10–24)
BUN: 22 mg/dL (ref 8–27)
Bilirubin Total: 1.1 mg/dL (ref 0.0–1.2)
CO2: 23 mmol/L (ref 20–29)
Calcium: 9.1 mg/dL (ref 8.6–10.2)
Chloride: 103 mmol/L (ref 96–106)
Creatinine, Ser: 1.33 mg/dL — ABNORMAL HIGH (ref 0.76–1.27)
Globulin, Total: 2.7 g/dL (ref 1.5–4.5)
Glucose: 114 mg/dL — ABNORMAL HIGH (ref 70–99)
Potassium: 4.4 mmol/L (ref 3.5–5.2)
Sodium: 141 mmol/L (ref 134–144)
Total Protein: 6.6 g/dL (ref 6.0–8.5)
eGFR: 60 mL/min/{1.73_m2} (ref >59)

## 2023-10-26 LAB — AFP TUMOR MARKER: AFP, Serum, Tumor Marker: <1.8 ng/mL (ref 0.0–8.4)

## 2023-11-01 ENCOUNTER — Other Ambulatory Visit: Payer: Self-pay | Admitting: *Deleted

## 2023-11-01 DIAGNOSIS — R053 Chronic cough: Secondary | ICD-10-CM

## 2023-11-01 DIAGNOSIS — R1310 Dysphagia, unspecified: Secondary | ICD-10-CM

## 2023-11-01 DIAGNOSIS — K219 Gastro-esophageal reflux disease without esophagitis: Secondary | ICD-10-CM

## 2023-11-22 ENCOUNTER — Telehealth: Payer: Self-pay | Admitting: *Deleted

## 2023-11-22 NOTE — Telephone Encounter (Signed)
 Pt calling to check on status of referral. Advised pt that referral was faxed and they did receive it. Once nurse reviews the notes they will contact him to get him scheduled.

## 2024-02-01 ENCOUNTER — Encounter: Payer: Self-pay | Admitting: Internal Medicine

## 2024-02-01 ENCOUNTER — Ambulatory Visit: Attending: Internal Medicine | Admitting: Internal Medicine

## 2024-02-01 VITALS — BP 118/70 | HR 76 | Ht 73.0 in | Wt 191.2 lb

## 2024-02-01 DIAGNOSIS — R0789 Other chest pain: Secondary | ICD-10-CM | POA: Insufficient documentation

## 2024-02-01 DIAGNOSIS — R079 Chest pain, unspecified: Secondary | ICD-10-CM | POA: Diagnosis not present

## 2024-02-01 DIAGNOSIS — R0609 Other forms of dyspnea: Secondary | ICD-10-CM | POA: Diagnosis not present
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
## 2024-02-01 NOTE — Patient Instructions (Addendum)
 Medication Instructions:  Your physician recommends that you continue on your current medications as directed. Please refer to the Current Medication list given to you today.   Labwork: BNP to be completed today at [facility] [place]  Testing/Procedures: Your physician has requested that you have an echocardiogram. Echocardiography is a painless test that uses sound waves to create images of your heart. It provides your doctor with information about the size and shape of your heart and how well your heart's chambers and valves are working. This procedure takes approximately one hour. There are no restrictions for this procedure. Please do NOT wear cologne, perfume, aftershave, or lotions (deodorant is allowed). Please arrive 15 minutes prior to your appointment time.  Please note: We ask at that you not bring children with you during ultrasound (echo/ vascular) testing. Due to room size and safety concerns, children are not allowed in the ultrasound rooms during exams. Our front office staff cannot provide observation of children in our lobby area while testing is being conducted. An adult accompanying a patient to their appointment will only be allowed in the ultrasound room at the discretion of the ultrasound technician under special circumstances. We apologize for any inconvenience.     Please report to Radiology at the [facility] Main Entrance 30 minutes early for your test.  [address] [place], [place] [address]                         OR   Please report to Radiology at [facility] Main Entrance, medical mall, 30 mins prior to your test.  [address]  [place], [place]  How to Prepare for Your Cardiac PET/CT Stress Test:  Nothing to eat or drink, except water , 3 hours prior to arrival time.  NO caffeine/decaffeinated products, or chocolate 12 hours prior to arrival. (Please note decaffeinated beverages (teas/coffees) still contain  caffeine).  If you have caffeine within 12 hours prior, the test will need to be rescheduled.  Medication instructions: Do not take erectile dysfunction medications for 72 hours prior to test (sildenafil, tadalafil) Do not take nitrates (isosorbide mononitrate, Ranexa) the day before or day of test Do not take tamsulosin the day before or morning of test Hold theophylline containing medications for 12 hours. Hold Dipyridamole 48 hours prior to the test.  Diabetic Preparation: If able to eat breakfast prior to 3 hour fasting, you may take all medications, including your insulin . Do not worry if you miss your breakfast dose of insulin  - start at your next meal. If you do not eat prior to 3 hour fast-Hold all diabetes (oral and insulin ) medications. Patients who wear a continuous glucose monitor MUST remove the device prior to scanning.  You may take your remaining medications with water .  NO perfume, cologne or lotion on chest or abdomen area. FEMALES - Please avoid wearing dresses to this appointment.  Total time is 1 to 2 hours; you may want to bring reading material for the waiting time.  IF YOU THINK YOU MAY BE PREGNANT, OR ARE NURSING PLEASE INFORM THE TECHNOLOGIST.  In preparation for your appointment, medication and supplies will be purchased.  Appointment availability is limited, so if you need to cancel or reschedule, please call the Radiology Department Scheduler at [phone] 24 hours in advance to avoid a cancellation fee of $100.00  What to Expect When you Arrive:  Once you arrive and check in for your appointment, you will  be taken to a preparation room within the Radiology Department.  A technologist or Nurse will obtain your medical history, verify that you are correctly prepped for the exam, and explain the procedure.  Afterwards, an IV will be started in your arm and electrodes will be placed on your skin for EKG monitoring during the stress portion of the exam. Then you  will be escorted to the PET/CT scanner.  There, staff will get you positioned on the scanner and obtain a blood pressure and EKG.  During the exam, you will continue to be connected to the EKG and blood pressure machines.  A small, safe amount of a radioactive tracer will be injected in your IV to obtain a series of pictures of your heart along with an injection of a stress agent.    After your Exam:  It is recommended that you eat a meal and drink a caffeinated beverage to counter act any effects of the stress agent.  Drink plenty of fluids for the remainder of the day and urinate frequently for the first couple of hours after the exam.  Your doctor will inform you of your test results within 7-10 business days.  For more information and frequently asked questions, please visit our website: [email or web address]  For questions about your test or how to prepare for your test, please call: Cardiac Imaging Nurse Navigators Office: [phone]   Follow-Up: Your physician recommends that you schedule a follow-up appointment in: 3 months  Any Other Special Instructions Will Be Listed Below (If Applicable). Thank you for choosing [facility]!     If you need a refill on your cardiac medications before your next appointment, please call your pharmacy.

## 2024-02-09 ENCOUNTER — Ambulatory Visit: Payer: Self-pay | Admitting: Internal Medicine

## 2024-02-16 ENCOUNTER — Ambulatory Visit: Attending: Internal Medicine

## 2024-02-16 DIAGNOSIS — R079 Chest pain, unspecified: Secondary | ICD-10-CM

## 2024-02-16 DIAGNOSIS — R0609 Other forms of dyspnea: Secondary | ICD-10-CM | POA: Diagnosis not present

## 2024-02-16 LAB — ECHOCARDIOGRAM COMPLETE
AR max vel: 3.06 cm2
AV Peak grad: 13.5 mmHg
Ao pk vel: 1.84 m/s
Area-P 1/2: 2.95 cm2
Calc EF: 70.2 %
S' Lateral: 2.5 cm
Single Plane A2C EF: 69.3 %
Single Plane A4C EF: 69.4 %

## 2024-03-01 ENCOUNTER — Encounter (HOSPITAL_COMMUNITY)

## 2024-03-28 ENCOUNTER — Encounter (HOSPITAL_COMMUNITY): Payer: Self-pay

## 2024-03-28 ENCOUNTER — Encounter: Payer: Self-pay | Admitting: Internal Medicine

## 2024-03-28 ENCOUNTER — Encounter (HOSPITAL_COMMUNITY): Admission: RE | Admit: 2024-03-28 | Source: Ambulatory Visit

## 2024-03-29 ENCOUNTER — Encounter: Payer: Self-pay | Admitting: Gastroenterology

## 2024-04-04 ENCOUNTER — Other Ambulatory Visit: Payer: Self-pay | Admitting: *Deleted

## 2024-04-04 ENCOUNTER — Other Ambulatory Visit: Payer: Self-pay

## 2024-04-04 ENCOUNTER — Other Ambulatory Visit: Payer: Self-pay | Admitting: Gastroenterology

## 2024-04-04 ENCOUNTER — Telehealth: Payer: Self-pay | Admitting: *Deleted

## 2024-04-04 DIAGNOSIS — R188 Other ascites: Secondary | ICD-10-CM

## 2024-04-04 DIAGNOSIS — K769 Liver disease, unspecified: Secondary | ICD-10-CM

## 2024-04-04 DIAGNOSIS — K7581 Nonalcoholic steatohepatitis (NASH): Secondary | ICD-10-CM

## 2024-04-04 NOTE — Telephone Encounter (Signed)
 Pt informed that para is scheduled for Wednesday 11/26, arrive at 9:30 am to check in at [facility] Radiology and will need to have lab work done also. RUQ US  scheduled for Monday 04/17/24, arrive at 11:15 am to check in at [facility] Radiology. NPO 6-8 hours prior to US . Pt verbalized understanding.

## 2024-04-04 NOTE — Addendum Note (Signed)
 Addended by: GAYLENE MADELIN CROME on: 04/04/2024 11:33 AM   Modules accepted: Orders

## 2024-04-04 NOTE — Telephone Encounter (Signed)
 Received a call from [facility]. Spoke to [place] , [place] for Dr.Williams. She stated that pt was seen this morning and Dr.Williams says needs to have a para done. Please advise. Thank you

## 2024-04-05 ENCOUNTER — Ambulatory Visit: Payer: Self-pay | Admitting: Gastroenterology

## 2024-04-05 ENCOUNTER — Encounter (HOSPITAL_COMMUNITY): Payer: Self-pay

## 2024-04-05 ENCOUNTER — Other Ambulatory Visit (HOSPITAL_COMMUNITY)
Admission: RE | Admit: 2024-04-05 | Discharge: 2024-04-05 | Disposition: A | Discharge status: Home / Self Care | Source: Ambulatory Visit | Attending: Gastroenterology | Admitting: Gastroenterology

## 2024-04-05 ENCOUNTER — Ambulatory Visit (HOSPITAL_COMMUNITY)
Admission: RE | Admit: 2024-04-05 | Discharge: 2024-04-05 | Disposition: A | Discharge status: Home / Self Care | Source: Ambulatory Visit | Attending: Gastroenterology | Admitting: Gastroenterology

## 2024-04-05 ENCOUNTER — Telehealth: Payer: Self-pay | Admitting: *Deleted

## 2024-04-05 VITALS — BP 120/66 | HR 72 | Temp 98.0°F | Resp 18

## 2024-04-05 DIAGNOSIS — R188 Other ascites: Secondary | ICD-10-CM | POA: Insufficient documentation

## 2024-04-05 DIAGNOSIS — K7460 Unspecified cirrhosis of liver: Secondary | ICD-10-CM | POA: Insufficient documentation

## 2024-04-05 LAB — CBC WITH DIFFERENTIAL/PLATELET
Abs Immature Granulocytes: 0.01 K/uL (ref 0.00–0.07)
Basophils Absolute: 0 K/uL (ref 0.0–0.1)
Basophils Relative: 1 %
Eosinophils Absolute: 0.1 K/uL (ref 0.0–0.5)
Eosinophils Relative: 5 %
HCT: 36 % — ABNORMAL LOW (ref 39.0–52.0)
Hemoglobin: 10.9 g/dL — ABNORMAL LOW (ref 13.0–17.0)
Immature Granulocytes: 0 %
Lymphocytes Relative: 25 %
Lymphs Abs: 0.6 K/uL — ABNORMAL LOW (ref 0.7–4.0)
MCH: 26.5 pg (ref 26.0–34.0)
MCHC: 30.3 g/dL (ref 30.0–36.0)
MCV: 87.6 fL (ref 80.0–100.0)
Monocytes Absolute: 0.2 K/uL (ref 0.1–1.0)
Monocytes Relative: 8 %
Neutro Abs: 1.5 K/uL — ABNORMAL LOW (ref 1.7–7.7)
Neutrophils Relative %: 61 %
Platelets: 36 K/uL — ABNORMAL LOW (ref 150–400)
RBC: 4.11 MIL/uL — ABNORMAL LOW (ref 4.22–5.81)
RDW: 15.9 % — ABNORMAL HIGH (ref 11.5–15.5)
WBC: 2.4 K/uL — ABNORMAL LOW (ref 4.0–10.5)
nRBC: 0 % (ref 0.0–0.2)

## 2024-04-05 LAB — GRAM STAIN

## 2024-04-05 LAB — COMPREHENSIVE METABOLIC PANEL WITH GFR
ALT: 15 U/L (ref 0–44)
AST: 23 U/L (ref 15–41)
Albumin: 3.9 g/dL (ref 3.5–5.0)
Alkaline Phosphatase: 85 U/L (ref 38–126)
Anion gap: 7 (ref 5–15)
BUN: 15 mg/dL (ref 8–23)
CO2: 32 mmol/L (ref 22–32)
Calcium: 9.3 mg/dL (ref 8.9–10.3)
Chloride: 103 mmol/L (ref 98–111)
Creatinine, Ser: 1.32 mg/dL — ABNORMAL HIGH (ref 0.61–1.24)
GFR, Estimated: >60 mL/min (ref >60)
Glucose, Bld: 128 mg/dL — ABNORMAL HIGH (ref 70–99)
Potassium: 4.8 mmol/L (ref 3.5–5.1)
Sodium: 141 mmol/L (ref 135–145)
Total Bilirubin: 1 mg/dL (ref 0.0–1.2)
Total Protein: 7.2 g/dL (ref 6.5–8.1)

## 2024-04-05 LAB — BODY FLUID CELL COUNT WITH DIFFERENTIAL
Lymphs, Fluid: 72 %
Monocyte-Macrophage-Serous Fluid: 25 % — ABNORMAL LOW (ref 50–90)
Neutrophil Count, Fluid: 3 % (ref 0–25)
Total Nucleated Cell Count, Fluid: 387 uL (ref 0–1000)

## 2024-04-05 LAB — ALBUMIN, PLEURAL OR PERITONEAL FLUID: Albumin, Fluid: <1.5 g/dL

## 2024-04-05 LAB — PROTIME-INR
INR: 1.4 — ABNORMAL HIGH (ref 0.8–1.2)
Prothrombin Time: 17.4 s — ABNORMAL HIGH (ref 11.4–15.2)

## 2024-04-05 MED ORDER — LIDOCAINE HCL (PF) 2 % IJ SOLN
10.0000 mL | Freq: Once | INTRAMUSCULAR | Status: AC
  Administered 2024-04-05: 10 mL

## 2024-04-05 NOTE — Telephone Encounter (Addendum)
 Received referral from Vaughn Pouch, MD with [facility]~ [phone] telephone/ [phone]~ fax  Reason for referral: hernia inguinal left  Notes from [facility] on 04/04/2024 report abdominal distention and bulging in left groin.  Physical exam reports findings of abdominal distention and abdominal pain due to ascites requiring paracentesis and inguinal hernia on the left side that is non-tender.   US  Guided paracentesis completed 04/05/2024.  Patient has dx of NASH w/ cirrhosis and esophageal varices.   Patient also voiced c/o chronic cough with dyspnea and was referred to cardiology (Dr. Mallipeddi). Patient was seen by cardiology for DOE in October 2025. Echocardiogram/ labs reassuring that chronic cough/ DOE is not cardiac in nature.   Patient requested Dr. Mavis for hernia evaluation. Is patient suitable candidate for our setting or should he be referred to tertiary?

## 2024-04-05 NOTE — Procedures (Signed)
PROCEDURE SUMMARY:  Successful ultrasound guided paracentesis from the right lower quadrant.  Yielded 4.0 L of clear yellow fluid.  No immediate complications.  The patient tolerated the procedure well.   Specimen was sent for labs.  EBL < 5mL  If the patient eventually requires >/=2 paracenteses in a 30 day period, screening evaluation by the [facility] will be assessed.  Lynnette Caffey, PA-C

## 2024-04-05 NOTE — Progress Notes (Signed)
 Patient tolerated right sided Paracentesis procedure well today and 4 Liters of clear yellow ascites removed with labs collected and sent to lab for processing. Patient verbalized understanding of discharge instructions and ambulatory at departure with no acute distress noted.

## 2024-04-06 LAB — AFP TUMOR MARKER: AFP, Serum, Tumor Marker: <1.8 ng/mL (ref 0.0–8.4)

## 2024-04-07 LAB — ACID FAST SMEAR (AFB, MYCOBACTERIA): Acid Fast Smear: NEGATIVE

## 2024-04-10 ENCOUNTER — Ambulatory Visit: Admitting: Cardiology

## 2024-04-10 LAB — CULTURE, BODY FLUID W GRAM STAIN -BOTTLE
Culture: NO GROWTH
Special Requests: ADEQUATE

## 2024-04-10 NOTE — Telephone Encounter (Signed)
 Discussed with Dr. Mavis.   Reports that patient is not a good candidate for [facility]/ [facility] due to cirrhosis and low platelets.   Call placed to [facility] and message left for referral coordinator.

## 2024-04-11 LAB — CYTOLOGY - NON PAP

## 2024-04-13 ENCOUNTER — Encounter: Payer: Self-pay | Admitting: Gastroenterology

## 2024-04-13 ENCOUNTER — Ambulatory Visit: Admitting: Gastroenterology

## 2024-04-13 VITALS — BP 120/75 | HR 86 | Temp 98.8°F | Ht 73.0 in | Wt 195.4 lb

## 2024-04-13 DIAGNOSIS — R053 Chronic cough: Secondary | ICD-10-CM

## 2024-04-13 DIAGNOSIS — K7581 Nonalcoholic steatohepatitis (NASH): Secondary | ICD-10-CM | POA: Diagnosis not present

## 2024-04-13 DIAGNOSIS — R059 Cough, unspecified: Secondary | ICD-10-CM

## 2024-04-13 DIAGNOSIS — K219 Gastro-esophageal reflux disease without esophagitis: Secondary | ICD-10-CM

## 2024-04-13 DIAGNOSIS — R188 Other ascites: Secondary | ICD-10-CM

## 2024-04-13 DIAGNOSIS — K7469 Other cirrhosis of liver: Secondary | ICD-10-CM

## 2024-04-13 DIAGNOSIS — I8510 Secondary esophageal varices without bleeding: Secondary | ICD-10-CM | POA: Diagnosis not present

## 2024-04-13 DIAGNOSIS — I8511 Secondary esophageal varices with bleeding: Secondary | ICD-10-CM

## 2024-04-13 DIAGNOSIS — R600 Localized edema: Secondary | ICD-10-CM

## 2024-04-13 MED ORDER — FUROSEMIDE 20 MG PO TABS: 20.0000 mg | ORAL_TABLET | Freq: Every day | ORAL | 2 refills | Status: AC

## 2024-04-13 MED ORDER — SPIRONOLACTONE 50 MG PO TABS: 50.0000 mg | ORAL_TABLET | Freq: Every day | ORAL | 2 refills | Status: AC

## 2024-04-13 NOTE — Patient Instructions (Addendum)
 I have sent in Lasix  20 mg and Aldactone 50 mg  for you to the pharmac you will take 1 tablet of each daily.  To help manage the abdominal swelling and distention.  Continue all of your other current medications as I am not making any changes at this dose today.  Continue the reflux Gourmet if it is helping you with your reflux as well.  After I see you for follow-up if you are having improvement of symptoms then we will talk about scheduling your repeat upper endoscopy to further evaluate your varices.  Please monitor for excessive weight gain, chest pain, swelling and difficulty breathing and monitor for any signs of bleeding like black/tarry stools or bright red blood in your stools.  I need to know immediately if these occur.  Cirrhosis Lifestyle Recommendations:  High-protein diet from a primarily plant-based diet. Avoid red meat.  No raw or undercooked meat, seafood, or shellfish. Low-fat/cholesterol/carbohydrate diet. Limit sodium to no more than 2000 mg/day including everything that you eat and drink. Recommend at least 30 minutes of aerobic and resistance exercise 3 days/week. Limit Tylenol  to 2000 mg daily.   To help prevent the fluid accumulation is very important that you follow the dietary recommendations above and limiting salt intake.  You will need to have labs done within the next 1-2 weeks to reevaluate your kidney function given I am starting you on diuretics  It was a pleasure to see you today. I want to create trusting relationships with patients. If you receive a survey regarding your visit,  I greatly appreciate you taking time to fill this out on paper or through your MyChart. I value your feedback.  Charmaine Melia, MSN, FNP-BC, AGACNP-BC [facility]
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
## 2024-04-14 ENCOUNTER — Other Ambulatory Visit: Payer: Self-pay | Admitting: *Deleted

## 2024-04-14 DIAGNOSIS — K7469 Other cirrhosis of liver: Secondary | ICD-10-CM

## 2024-04-14 DIAGNOSIS — R188 Other ascites: Secondary | ICD-10-CM

## 2024-04-17 ENCOUNTER — Ambulatory Visit (HOSPITAL_COMMUNITY)
Admission: RE | Admit: 2024-04-17 | Discharge: 2024-04-17 | Disposition: A | Source: Ambulatory Visit | Attending: Gastroenterology

## 2024-04-17 ENCOUNTER — Ambulatory Visit: Payer: Self-pay | Admitting: Gastroenterology

## 2024-04-17 DIAGNOSIS — K769 Liver disease, unspecified: Secondary | ICD-10-CM

## 2024-04-17 DIAGNOSIS — K7581 Nonalcoholic steatohepatitis (NASH): Secondary | ICD-10-CM

## 2024-04-17 DIAGNOSIS — R188 Other ascites: Secondary | ICD-10-CM

## 2024-04-17 LAB — GRAM STAIN

## 2024-04-17 NOTE — Procedures (Signed)
 PROCEDURE SUMMARY:  Successful ultrasound guided paracentesis from the right lower quadrant.  Yielded 3.7 liters of straw colored fluid.  No immediate complications.  The patient tolerated the procedure well.   Specimen was sent for labs.  EBL < 5mL  If the patient eventually requires >/=2 paracenteses in a 30 day period, screening evaluation by the [facility] will be assessed.

## 2024-04-18 LAB — BODY FLUID CELL COUNT WITH DIFFERENTIAL
Lymphs, Fluid: 75 %
Monocyte-Macrophage-Serous Fluid: 15 % — ABNORMAL LOW (ref 50–90)
Neutrophil Count, Fluid: 10 % (ref 0–25)
Total Nucleated Cell Count, Fluid: 383 uL (ref 0–1000)

## 2024-04-21 ENCOUNTER — Ambulatory Visit: Payer: Self-pay | Admitting: Gastroenterology

## 2024-04-22 LAB — CULTURE, BODY FLUID W GRAM STAIN -BOTTLE

## 2024-04-27 LAB — CULTURE, BODY FLUID W GRAM STAIN -BOTTLE
Gram Stain: NO GROWTH
Report Status: NO GROWTH

## 2024-04-28 ENCOUNTER — Ambulatory Visit: Payer: Self-pay | Admitting: Gastroenterology

## 2024-04-28 LAB — COMPREHENSIVE METABOLIC PANEL WITH GFR
ALT: 15 IU/L (ref 0–44)
AST: 23 IU/L (ref 0–40)
Albumin: 4.1 g/dL (ref 3.9–4.9)
Alkaline Phosphatase: 78 IU/L (ref 47–123)
BUN/Creatinine Ratio: 20 (ref 10–24)
BUN: 32 mg/dL — ABNORMAL HIGH (ref 8–27)
Bilirubin Total: 0.9 mg/dL (ref 0.0–1.2)
CO2: 22 mmol/L (ref 20–29)
Calcium: 9.3 mg/dL (ref 8.6–10.2)
Chloride: 96 mmol/L (ref 96–106)
Creatinine, Ser: 1.6 mg/dL — ABNORMAL HIGH (ref 0.76–1.27)
Globulin, Total: 3.3 g/dL (ref 1.5–4.5)
Glucose: 164 mg/dL — ABNORMAL HIGH (ref 70–99)
Potassium: 4 mmol/L (ref 3.5–5.2)
Sodium: 140 mmol/L (ref 134–144)
Total Protein: 7.4 g/dL (ref 6.0–8.5)
eGFR: 48 mL/min/1.73 — ABNORMAL LOW

## 2024-05-02 ENCOUNTER — Ambulatory Visit: Admitting: Internal Medicine

## 2024-05-02 ENCOUNTER — Other Ambulatory Visit (HOSPITAL_COMMUNITY)

## 2024-05-02 ENCOUNTER — Other Ambulatory Visit: Payer: Self-pay | Admitting: *Deleted

## 2024-05-02 DIAGNOSIS — K7460 Unspecified cirrhosis of liver: Secondary | ICD-10-CM

## 2024-05-02 DIAGNOSIS — N179 Acute kidney failure, unspecified: Secondary | ICD-10-CM

## 2024-05-17 ENCOUNTER — Telehealth: Payer: Self-pay | Admitting: Gastroenterology

## 2024-05-17 DIAGNOSIS — R1310 Dysphagia, unspecified: Secondary | ICD-10-CM

## 2024-05-17 NOTE — Telephone Encounter (Signed)
 Spoke to pt, informed him of results and recommendations. Pt voiced understanding. Also sent pt a MyChart message.

## 2024-05-17 NOTE — Telephone Encounter (Signed)
 Reviewed his manometry results.  Lourie Retz please call patient with his results.  Please let him know I am going to send him a MyChart message with some more information about the EGJ outflow obstruction.  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   In order to better determine potential for Botox injection to the sphincter or the need for more invasive treatment I recommend that we get a timed barium esophagram and in the meantime I will discuss this case further with Dr. Cindie to see if Botox injection to the sphincter would be feasible and if he feels as though this may be beneficial in his case.   Mindy/Tammy -please order barium pill esophagram (double) put in the comments or protocol specify time barium esophagram with tablet to ensure images are obtained at 1 minute and 5 minutes. Dx: Esophagogastric junction outflow obstruction, dysphagia  Charmaine Melia, MSN, APRN, FNP-BC, AGACNP-BC [facility]

## 2024-05-18 NOTE — Addendum Note (Signed)
 Addended by: JEANELL GRAEME RAMAN on: 05/18/2024 09:56 AM   Modules accepted: Orders

## 2024-05-22 ENCOUNTER — Ambulatory Visit (HOSPITAL_COMMUNITY)
Admission: RE | Admit: 2024-05-22 | Discharge: 2024-05-22 | Disposition: A | Discharge status: Home / Self Care | Source: Ambulatory Visit | Attending: Gastroenterology | Admitting: Gastroenterology

## 2024-05-22 ENCOUNTER — Other Ambulatory Visit: Payer: Self-pay | Admitting: Gastroenterology

## 2024-05-22 DIAGNOSIS — R1310 Dysphagia, unspecified: Secondary | ICD-10-CM | POA: Insufficient documentation

## 2024-05-28 ENCOUNTER — Ambulatory Visit: Payer: Self-pay | Admitting: Gastroenterology

## 2024-05-30 ENCOUNTER — Ambulatory Visit: Admitting: General Surgery

## 2024-06-08 ENCOUNTER — Ambulatory Visit: Attending: Internal Medicine | Admitting: Internal Medicine

## 2024-06-08 ENCOUNTER — Encounter: Payer: Self-pay | Admitting: Internal Medicine

## 2024-06-08 VITALS — BP 108/60 | HR 72 | Ht 73.0 in | Wt 177.0 lb

## 2024-06-08 DIAGNOSIS — I10 Essential (primary) hypertension: Secondary | ICD-10-CM | POA: Diagnosis not present
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
## 2024-06-08 NOTE — Patient Instructions (Signed)

## 2024-07-10 ENCOUNTER — Ambulatory Visit: Admitting: Gastroenterology
